# Patient Record
Sex: Female | Born: 1970 | Race: Black or African American | Hispanic: No | Marital: Single | Smoking: Current some day smoker
Health system: Southern US, Community
[De-identification: ages and names within clinical notes are randomized; demographics above are authoritative.]

## PROBLEM LIST (undated history)

## (undated) DIAGNOSIS — K589 Irritable bowel syndrome without diarrhea: Secondary | ICD-10-CM

## (undated) DIAGNOSIS — N83202 Unspecified ovarian cyst, left side: Secondary | ICD-10-CM

## (undated) DIAGNOSIS — K760 Fatty (change of) liver, not elsewhere classified: Secondary | ICD-10-CM

## (undated) DIAGNOSIS — I10 Essential (primary) hypertension: Secondary | ICD-10-CM

## (undated) HISTORY — DX: Unspecified ovarian cyst, left side: N83.202

## (undated) HISTORY — DX: Fatty (change of) liver, not elsewhere classified: K76.0

## (undated) HISTORY — DX: Irritable bowel syndrome, unspecified: K58.9

## (undated) HISTORY — PX: ABDOMINAL HYSTERECTOMY: SHX81

## (undated) HISTORY — DX: Essential (primary) hypertension: I10

---

## 1998-07-11 ENCOUNTER — Other Ambulatory Visit: Admission: RE | Admit: 1998-07-11 | Discharge: 1998-07-11 | Payer: Self-pay | Admitting: Obstetrics and Gynecology

## 1999-12-05 ENCOUNTER — Other Ambulatory Visit: Admission: RE | Admit: 1999-12-05 | Discharge: 1999-12-05 | Payer: Self-pay | Admitting: Obstetrics and Gynecology

## 2001-07-28 ENCOUNTER — Other Ambulatory Visit: Admission: RE | Admit: 2001-07-28 | Discharge: 2001-07-28 | Payer: Self-pay | Admitting: Obstetrics and Gynecology

## 2003-06-25 ENCOUNTER — Emergency Department (HOSPITAL_COMMUNITY): Admission: EM | Admit: 2003-06-25 | Discharge: 2003-06-25 | Payer: Self-pay | Admitting: *Deleted

## 2003-06-26 ENCOUNTER — Other Ambulatory Visit: Admission: RE | Admit: 2003-06-26 | Discharge: 2003-06-26 | Payer: Self-pay | Admitting: Obstetrics and Gynecology

## 2004-08-08 ENCOUNTER — Emergency Department (HOSPITAL_COMMUNITY): Admission: EM | Admit: 2004-08-08 | Discharge: 2004-08-08 | Payer: Self-pay | Admitting: *Deleted

## 2005-09-29 ENCOUNTER — Ambulatory Visit: Payer: Self-pay | Admitting: Internal Medicine

## 2005-12-28 ENCOUNTER — Emergency Department (HOSPITAL_COMMUNITY): Admission: EM | Admit: 2005-12-28 | Discharge: 2005-12-28 | Payer: Self-pay | Admitting: Emergency Medicine

## 2006-05-11 ENCOUNTER — Encounter: Payer: Self-pay | Admitting: Internal Medicine

## 2006-06-30 ENCOUNTER — Ambulatory Visit (HOSPITAL_COMMUNITY): Admission: RE | Admit: 2006-06-30 | Discharge: 2006-06-30 | Payer: Self-pay | Admitting: Obstetrics and Gynecology

## 2006-10-18 ENCOUNTER — Other Ambulatory Visit: Payer: Self-pay | Admitting: Obstetrics & Gynecology

## 2006-10-20 ENCOUNTER — Encounter: Payer: Self-pay | Admitting: Obstetrics & Gynecology

## 2006-10-20 ENCOUNTER — Inpatient Hospital Stay (HOSPITAL_COMMUNITY): Admission: RE | Admit: 2006-10-20 | Discharge: 2006-10-23 | Payer: Self-pay | Admitting: Obstetrics & Gynecology

## 2010-07-22 ENCOUNTER — Other Ambulatory Visit: Payer: Self-pay | Admitting: Obstetrics & Gynecology

## 2010-07-24 ENCOUNTER — Other Ambulatory Visit: Payer: Self-pay | Admitting: Obstetrics & Gynecology

## 2010-07-24 ENCOUNTER — Ambulatory Visit (HOSPITAL_COMMUNITY)
Admission: RE | Admit: 2010-07-24 | Discharge: 2010-07-24 | Disposition: A | Discharge status: Home / Self Care | Payer: BC Managed Care – PPO | Source: Ambulatory Visit | Attending: Obstetrics & Gynecology | Admitting: Obstetrics & Gynecology

## 2010-07-24 DIAGNOSIS — K769 Liver disease, unspecified: Secondary | ICD-10-CM | POA: Insufficient documentation

## 2010-07-24 DIAGNOSIS — R109 Unspecified abdominal pain: Secondary | ICD-10-CM | POA: Insufficient documentation

## 2010-08-19 NOTE — H&P (Signed)
NAMESINTIA, MCKISSIC             ACCOUNT NO.:  [id number]   MEDICAL RECORD NO.:  [id number]          PATIENT TYPE:  INP   LOCATION:  A339                          FACILITY:  [facility]   PHYSICIAN:  Lazaro Arms, M.D.   DATE OF BIRTH:  [birth year]   DATE OF ADMISSION:  10/20/2006  DATE OF DISCHARGE:  LH                              HISTORY & PHYSICAL   Regina Castillo is a 40 year old African American female status post C-section  x3 with hypertension who has an enlarged fibroid uterus, approximately  18-week size, with significant menometrorrhagia and dysmenorrhea that  has been worsening over time.  She was considering at one time having  ablation when she saw another physician, and I told her that I really  did not think this was an option given her significantly enlarged  fibroid uterus and the pain that is associated with it, not necessarily  with her period.  She took awhile to think about it and has decided that  is appropriate therapy.  As a result, we will proceed with TAH and  preserve her ovaries.   PAST MEDICAL HISTORY:  Hypertension.   PAST SURGICAL HISTORY:  C-section x3.   MEDICATIONS:  Norvasc and hydrochlorothiazide.   REVIEW OF SYSTEMS:  Otherwise negative.   ALLERGIES:  NO KNOWN DRUG ALLERGIES.   PHYSICAL EXAMINATION:  HEENT:  Unremarkable.  NECK:  Thyroid is normal.  LUNGS:  Clear.  HEART:  Regular rate and rhythm without murmurs, rubs, or gallops.  BREASTS:  Without masses, discharge, or skin changes.  ABDOMEN:  Soft.  There was a pelvic mass you could feel rising up out of  the pelvis to just below the umbilicus.  PELVIC:  Enlarged fibroid uterus.  She has normal external female  genitalia.  The uterus rises to just below the umbilicus.  The adnexa is  negative.  EXTREMITIES:  Warm with no edema.  NEUROLOGIC:  Grossly intact.   IMPRESSION:  1. Enlarged fibroid uterus, 18-week size.  2. Menometrorrhagia.  3. Dysmenorrhea.   PLAN:  The patient is admitted  for a TAH.  She understands the risks,  benefits, indications, and alternatives and will proceed.      Lazaro Arms, M.D.  Electronically Signed     LHE/MEDQ  D:  10/22/2006  T:  10/22/2006  Job:  [id number]

## 2010-08-19 NOTE — Op Note (Signed)
Regina Castillo, Regina Castillo             ACCOUNT NO.:  [id number]   MEDICAL RECORD NO.:  [id number]          PATIENT TYPE:  INP   LOCATION:  A339                          FACILITY:  [facility]   PHYSICIAN:  Lazaro Arms, M.D.   DATE OF BIRTH:  [birth year]   DATE OF PROCEDURE:  DATE OF DISCHARGE:                               OPERATIVE REPORT   PREOPERATIVE DIAGNOSES:  1. An 18-week size fibroid uterus.  2. Menometrorrhagia.  3. Dysmenorrhea.   POSTOPERATIVE DIAGNOSES:  1. An 18-week size fibroid uterus.  2. Menometrorrhagia.  3. Dysmenorrhea.   PROCEDURE:  Abdominal hysterectomy.   SURGEON:  Lazaro Arms, M.D.   ANESTHESIA:  General endotracheal.   FINDINGS:  Patient had multiple fibroid uterus, was up to the level of  her umbilicus.  Ovaries and tubes were normal.  The rest of the  peritoneal cavity was normal.   DESCRIPTION OF OPERATION:  Patient was taken to the operating room and  placed in the supine position, where she underwent general endotracheal  anesthesia.  The vagina was prepped.  A Foley catheter was placed.  Abdomen was prepped and draped in the usual sterile fashion.  A  Pfannenstiel skin incision was made and carried down sharply to the  rectus fascia, which was scored in the midline and extended laterally.  The fascia was taken off the muscles superiorly and inferiorly without  difficulty.  The peritoneal cavity was entered.  A small Deaver was used  for sidewall retraction, but the uterus was delivered through the  incision.  The upper abdomen was packed away.  The left round ligament  was suture ligated and cut.  The utero-ovarian ligament was isolated.  It was clamped, cut, and double suture-ligated.  The vesicouterine  serosal flap on the left was created.  The right round ligament was  isolated.  It was suture ligated and cut.  The utero-ovarian ligament on  the right was clamped, cut, and double suture ligated.  The  vesicouterine serosal flap on the right  was created.  She had dense  adhesions of the bladder to the uterus and lower uterine segment.  Great  care was taken to sharply take down the bladder and not to injure it.  I  actually had to do interfascial procedure in order to get it down off  the cervix safely.  The uterine vessels were then clamped, cut, and  suture ligated.  A pedicle was taken down the cervix to the cardinal  ligament.  The pedicle was clamped, cut, and suture ligated bilaterally.  The uterus was then amputated.  Serial pedicles were then taken down the  cervix through the cardinal ligaments, each pedicle being clamped, cut,  and suture-ligated.  They incised the vagina in the midline.  Again,  insuring being well away from the bladder.  The bladder was dissected  down.  The cervix was then removed sharply.  Vaginal angle sutures were  placed bilaterally, and the vagina was closed with interrupted figure-of-  eight sutures.  The pelvis was irrigated vigorously.  All pedicles were  found to be hemostatic.  The  Intercede was placed over the vaginal cuff  to try to prevent postoperative adhesions.  All pedicles again were  inspected and found to be hemostatic.  The peritoneum was reapproximated  loosely.  The fascia was closed using 0 Vicryl running.  Subcutaneous  tissue was made hemostatic and irrigated.  The skin actually was  redundant once I removed the uterus, so I took about 1.5 cm of skin  superiorly and removed it and subcutaneous fat as well.  I made it  hemostatic, irrigated it, and then reapproximated with skin staples.  The patient tolerated the procedure well.  She experienced 250 cc of  blood loss and was taken to the recovery room in good, stable condition.  Counts were correct x3.  She received Ancef prophylactically.      Lazaro Arms, M.D.  Electronically Signed     LHE/MEDQ  D:  10/20/2006  T:  10/20/2006  Job:  [id number]

## 2010-08-19 NOTE — Discharge Summary (Signed)
NAMEJAYLYN, Castillo             ACCOUNT NO.:  [id number]   MEDICAL RECORD NO.:  [id number]          PATIENT TYPE:  INP   LOCATION:  A339                          FACILITY:  [facility]   PHYSICIAN:  Lazaro Arms, M.D.   DATE OF BIRTH:  December 09, 1970   DATE OF ADMISSION:  10/20/2006  DATE OF DISCHARGE:  07/19/2008LH                               DISCHARGE SUMMARY   DISCHARGE DIAGNOSES:  1. Status post abdominal hysterectomy.  2. Unremarkable postoperative course.   PROCEDURE:  Abdominal hysterectomy.   Please refer to the History and Physical and operative report for  details of admission to the hospital.   HOSPITAL COURSE:  The patient was admitted after surgery.  Intraoperative course unremarkable.  She tolerated clear liquids, had  some problems with nausea and vomiting postoperatively that resolved in  about 48 hours.  As a result, we kept her an extra day.  She progressed  to full liquids and a regular diet and did well.  She has tolerated oral  pain medicines.  She has been essentially ambulatory, she has been  afebrile.  Hemoglobin and hematocrit postoperatively have been stable.  Again, her incision is clean, dry and intact.  She is discharged to home  on the morning of postop day #3 in good stable condition to follow up in  the office next Wednesday to have her staples removed.  She was given  instructions, precautions for return prior to that time.  She is  discharged home on Percocet, Motrin and Phenergan.      Lazaro Arms, M.D.  Electronically Signed     LHE/MEDQ  D:  10/23/2006  T:  10/23/2006  Job:  [id number]

## 2010-09-09 ENCOUNTER — Ambulatory Visit (INDEPENDENT_AMBULATORY_CARE_PROVIDER_SITE_OTHER): Payer: BC Managed Care – PPO | Admitting: Internal Medicine

## 2010-10-23 ENCOUNTER — Ambulatory Visit (INDEPENDENT_AMBULATORY_CARE_PROVIDER_SITE_OTHER): Payer: BC Managed Care – PPO | Admitting: Internal Medicine

## 2010-11-06 ENCOUNTER — Encounter (INDEPENDENT_AMBULATORY_CARE_PROVIDER_SITE_OTHER): Payer: Self-pay

## 2010-12-09 ENCOUNTER — Ambulatory Visit (INDEPENDENT_AMBULATORY_CARE_PROVIDER_SITE_OTHER): Payer: BC Managed Care – PPO | Admitting: Internal Medicine

## 2011-01-19 LAB — CBC
HCT: 31.5 — ABNORMAL LOW
Hemoglobin: 10.6 — ABNORMAL LOW
Hemoglobin: 10.7 — ABNORMAL LOW
Platelets: 321
RBC: 3.41 — ABNORMAL LOW
RBC: 3.41 — ABNORMAL LOW
RDW: 13.6
WBC: 10.8 — ABNORMAL HIGH
WBC: 18.9 — ABNORMAL HIGH

## 2011-01-19 LAB — DIFFERENTIAL
Basophils Absolute: 0
Eosinophils Absolute: 0
Eosinophils Absolute: 0.1
Eosinophils Relative: 0
Lymphs Abs: 2.2
Monocytes Absolute: 1.2 — ABNORMAL HIGH
Monocytes Relative: 9
Neutro Abs: 15.9 — ABNORMAL HIGH
Neutro Abs: 7.3
Neutrophils Relative %: 68

## 2011-01-19 LAB — URINALYSIS, ROUTINE W REFLEX MICROSCOPIC
Bilirubin Urine: NEGATIVE
Glucose, UA: NEGATIVE
Ketones, ur: NEGATIVE
Specific Gravity, Urine: 1.02
Urobilinogen, UA: 0.2
pH: 6

## 2011-01-19 LAB — URINE MICROSCOPIC-ADD ON

## 2011-01-20 LAB — COMPREHENSIVE METABOLIC PANEL
AST: 18
Alkaline Phosphatase: 59
BUN: 12
CO2: 29
GFR calc non Af Amer: >60
Potassium: 4
Total Protein: 6.7

## 2011-01-20 LAB — TYPE AND SCREEN
ABO/RH(D): B NEG
ABO/RH(D): B NEG
Antibody Screen: NEGATIVE
Antibody Screen: NEGATIVE

## 2011-01-20 LAB — CBC
HCT: 37.3
Hemoglobin: 12.6
RDW: 13.5
WBC: 6.9

## 2011-01-20 LAB — DIFFERENTIAL
Basophils Relative: 1
Lymphs Abs: 2.2
Monocytes Relative: 10
Neutro Abs: 3.9

## 2011-01-20 LAB — HCG, SERUM, QUALITATIVE: Preg, Serum: NEGATIVE

## 2012-07-28 ENCOUNTER — Other Ambulatory Visit (HOSPITAL_COMMUNITY): Payer: Self-pay | Admitting: Internal Medicine

## 2012-07-28 DIAGNOSIS — Z139 Encounter for screening, unspecified: Secondary | ICD-10-CM

## 2012-08-02 ENCOUNTER — Ambulatory Visit (HOSPITAL_COMMUNITY): Payer: BC Managed Care – PPO

## 2012-08-08 ENCOUNTER — Ambulatory Visit (HOSPITAL_COMMUNITY): Payer: BC Managed Care – PPO

## 2012-12-08 ENCOUNTER — Other Ambulatory Visit: Payer: Self-pay | Admitting: Obstetrics & Gynecology

## 2015-01-15 ENCOUNTER — Other Ambulatory Visit: Payer: Self-pay | Admitting: Obstetrics & Gynecology

## 2015-02-12 ENCOUNTER — Encounter: Payer: Self-pay | Admitting: Obstetrics & Gynecology

## 2015-02-12 ENCOUNTER — Ambulatory Visit (INDEPENDENT_AMBULATORY_CARE_PROVIDER_SITE_OTHER): Payer: BC Managed Care – PPO | Admitting: Obstetrics & Gynecology

## 2015-02-12 VITALS — BP 120/80 | HR 72 | Ht 60.0 in | Wt 145.0 lb

## 2015-02-12 DIAGNOSIS — Z01419 Encounter for gynecological examination (general) (routine) without abnormal findings: Secondary | ICD-10-CM | POA: Diagnosis not present

## 2015-02-12 DIAGNOSIS — E038 Other specified hypothyroidism: Secondary | ICD-10-CM

## 2015-02-12 DIAGNOSIS — R7989 Other specified abnormal findings of blood chemistry: Secondary | ICD-10-CM

## 2015-02-12 DIAGNOSIS — E7800 Pure hypercholesterolemia, unspecified: Secondary | ICD-10-CM

## 2015-02-13 LAB — VITAMIN D 25 HYDROXY (VIT D DEFICIENCY, FRACTURES): Vit D, 25-Hydroxy: 24.7 ng/mL — ABNORMAL LOW (ref 30.0–100.0)

## 2015-02-13 LAB — LIPID PANEL
CHOL/HDL RATIO: 2.2 ratio (ref 0.0–4.4)
Cholesterol, Total: 185 mg/dL (ref 100–199)
HDL: 86 mg/dL (ref >39)
LDL Calculated: 83 mg/dL (ref 0–99)
Triglycerides: 78 mg/dL (ref 0–149)
VLDL Cholesterol Cal: 16 mg/dL (ref 5–40)

## 2015-02-13 LAB — TSH: TSH: 1.49 u[IU]/mL (ref 0.450–4.500)

## 2015-02-13 NOTE — Progress Notes (Signed)
Patient ID: Regina Castillo, female   DOB: 1971-02-10, 44 y.o.   MRN: [id number] Subjective:     Regina NetStephanie R Castillo is a 44 y.o. female here for a routine exam.  No LMP recorded. Patient has had a hysterectomy. No obstetric history on file. Birth Control Method:  hysterectomy Menstrual Calendar(currently): hysterectomy  Current complaints: none.   Current acute medical issues:  none   Recent Gynecologic History No LMP recorded. Patient has had a hysterectomy. Last Pap: ,   Last mammogram: 2012,  normal  Past Medical History  Diagnosis Date  . Fatty liver   . Irritable bowel syndrome   . Cyst of ovary, left     Past Surgical History  Procedure Laterality Date  . Abdominal hysterectomy      OB History    No data available      Social History   Social History  . Marital Status: Single    Spouse Name: N/A  . Number of Children: N/A  . Years of Education: N/A   Social History Main Topics  . Smoking status: Current Every Day Smoker  . Smokeless tobacco: None  . Alcohol Use: None  . Drug Use: None  . Sexual Activity: Not Asked   Other Topics Concern  . None   Social History Narrative    History reviewed. No pertinent family history.   Current outpatient prescriptions:  .  amLODipine (NORVASC) 10 MG tablet, Take by mouth daily.  , Disp: , Rfl:  .  benazepril-hydrochlorthiazide (LOTENSIN HCT) 10-12.5 MG per tablet, Take by mouth daily.  , Disp: , Rfl:  .  calcium-vitamin D (OSCAL) 250-125 MG-UNIT per tablet, Take by mouth daily.  , Disp: , Rfl:   Review of Systems  Review of Systems  Constitutional: Negative for fever, chills, weight loss, malaise/fatigue and diaphoresis.  HENT: Negative for hearing loss, ear pain, nosebleeds, congestion, sore throat, neck pain, tinnitus and ear discharge.   Eyes: Negative for blurred vision, double vision, photophobia, pain, discharge and redness.  Respiratory: Negative for cough, hemoptysis, sputum production, shortness of  breath, wheezing and stridor.   Cardiovascular: Negative for chest pain, palpitations, orthopnea, claudication, leg swelling and PND.  Gastrointestinal: negative for abdominal pain. Negative for heartburn, nausea, vomiting, diarrhea, constipation, blood in stool and melena.  Genitourinary: Negative for dysuria, urgency, frequency, hematuria and flank pain.  Musculoskeletal: Negative for myalgias, back pain, joint pain and falls.  Skin: Negative for itching and rash.  Neurological: Negative for dizziness, tingling, tremors, sensory change, speech change, focal weakness, seizures, loss of consciousness, weakness and headaches.  Endo/Heme/Allergies: Negative for environmental allergies and polydipsia. Does not bruise/bleed easily.  Psychiatric/Behavioral: Negative for depression, suicidal ideas, hallucinations, memory loss and substance abuse. The patient is not nervous/anxious and does not have insomnia.        Objective:  Blood pressure 120/80, pulse 72, height 5' (1.524 m), weight 145 lb (65.772 kg).   Physical Exam  Vitals reviewed. Constitutional: She is oriented to person, place, and time. She appears well-developed and well-nourished.  HENT:  Head: Normocephalic and atraumatic.        Right Ear: External ear normal.  Left Ear: External ear normal.  Nose: Nose normal.  Mouth/Throat: Oropharynx is clear and moist.  Eyes: Conjunctivae and EOM are normal. Pupils are equal, round, and reactive to light. Right eye exhibits no discharge. Left eye exhibits no discharge. No scleral icterus.  Neck: Normal range of motion. Neck supple. No tracheal deviation present. No thyromegaly present.  Cardiovascular: Normal rate, regular rhythm, normal heart sounds and intact distal pulses.  Exam reveals no gallop and no friction rub.   No murmur heard. Respiratory: Effort normal and breath sounds normal. No respiratory distress. She has no wheezes. She has no rales. She exhibits no tenderness.  GI: Soft.  Bowel sounds are normal. She exhibits no distension and no mass. There is no tenderness. There is no rebound and no guarding.  Genitourinary:  Breasts no masses skin changes or nipple changes bilaterally      Vulva is normal without lesions Vagina is pink moist without discharge Cervix absent Uterus is absent Adnexa is negative with normal sized ovaries   Musculoskeletal: Normal range of motion. She exhibits no edema and no tenderness.  Neurological: She is alert and oriented to person, place, and time. She has normal reflexes. She displays normal reflexes. No cranial nerve deficit. She exhibits normal muscle tone. Coordination normal.  Skin: Skin is warm and dry. No rash noted. No erythema. No pallor.  Psychiatric: She has a normal mood and affect. Her behavior is normal. Judgment and thought content normal.       Assessment:    Healthy female exam.    Plan:    Mammogram ordered. Follow up in: 1 year. TSH VIT D Lipid panel

## 2015-02-14 ENCOUNTER — Telehealth: Payer: Self-pay | Admitting: *Deleted

## 2015-02-14 NOTE — Telephone Encounter (Signed)
-----   Message from Luther H Eure, MD sent at 02/14/2015  8:22 AM EST ----- Call pt and tell her vit d is low start taking 2000-5000 units of vitamin D3 daily with a meal  Thanks  Dr Eure ----- Message -----    From: [facility] Lab Results In Interface    Sent: 02/13/2015   7:43 AM      To: Luther H Eure, MD    

## 2015-02-15 NOTE — Telephone Encounter (Signed)
-----   Message from Lazaro ArmsLuther H Eure, MD sent at 02/14/2015  8:22 AM EST ----- Call pt and tell her vit d is low start taking 2000-5000 units of vitamin D3 daily with a meal  Thanks  Dr Despina HiddenEure ----- Message -----    From: [facility] Lab Results In Interface    Sent: 02/13/2015   7:43 AM      To: Lazaro ArmsLuther H Eure, MD

## 2015-02-18 ENCOUNTER — Telehealth: Payer: Self-pay | Admitting: Obstetrics & Gynecology

## 2015-02-18 NOTE — Telephone Encounter (Signed)
Pt informed of low Vit D from 02/12/2015 per Dr. Despina HiddenEure take 2000-5000 unit of Vitamin D3 daily with meals. Pt verbalized understanding.

## 2015-02-18 NOTE — Telephone Encounter (Signed)
Pt informed of low Vit D start taking Vit D3 2000-5000 daily with meals per Dr. Despina HiddenEure.

## 2015-09-26 ENCOUNTER — Encounter: Payer: Self-pay | Admitting: Obstetrics & Gynecology

## 2016-02-14 ENCOUNTER — Other Ambulatory Visit: Payer: BC Managed Care – PPO | Admitting: Obstetrics & Gynecology

## 2016-02-17 ENCOUNTER — Other Ambulatory Visit: Payer: BC Managed Care – PPO | Admitting: Obstetrics & Gynecology

## 2016-03-10 ENCOUNTER — Ambulatory Visit (INDEPENDENT_AMBULATORY_CARE_PROVIDER_SITE_OTHER): Payer: BLUE CROSS/BLUE SHIELD | Admitting: Advanced Practice Midwife

## 2016-03-10 ENCOUNTER — Encounter (INDEPENDENT_AMBULATORY_CARE_PROVIDER_SITE_OTHER): Payer: Self-pay

## 2016-03-10 ENCOUNTER — Encounter: Payer: Self-pay | Admitting: Advanced Practice Midwife

## 2016-03-10 VITALS — BP 134/82 | HR 72 | Ht 59.75 in | Wt 147.0 lb

## 2016-03-10 DIAGNOSIS — R7989 Other specified abnormal findings of blood chemistry: Secondary | ICD-10-CM

## 2016-03-10 DIAGNOSIS — Z01411 Encounter for gynecological examination (general) (routine) with abnormal findings: Secondary | ICD-10-CM | POA: Diagnosis not present

## 2016-03-10 DIAGNOSIS — E559 Vitamin D deficiency, unspecified: Secondary | ICD-10-CM | POA: Diagnosis not present

## 2016-03-10 DIAGNOSIS — Z1329 Encounter for screening for other suspected endocrine disorder: Secondary | ICD-10-CM

## 2016-03-10 DIAGNOSIS — Z01419 Encounter for gynecological examination (general) (routine) without abnormal findings: Secondary | ICD-10-CM

## 2016-03-10 MED ORDER — VITAMIN D 50 MCG (2000 UT) PO TABS: 2000.0000 [IU] | ORAL_TABLET | Freq: Every day | ORAL | 11 refills | Status: DC

## 2016-03-10 NOTE — Progress Notes (Signed)
Regina Castillo 45 y.o.  Vitals:   03/10/16 0856  BP: 134/82  Pulse: 72     Filed Weights   03/10/16 0856  Weight: 147 lb (66.7 kg)    Past Medical History: Past Medical History:  Diagnosis Date  . Cyst of ovary, left   . Fatty liver   . Irritable bowel syndrome     Past Surgical History: Past Surgical History:  Procedure Laterality Date  . ABDOMINAL HYSTERECTOMY      Family History: Family History  Problem Relation Age of Onset  . Diabetes Father   . Diabetes Mother     Social History: Social History  Substance Use Topics  . Smoking status: Current Every Day Smoker    Packs/day: 0.25  . Smokeless tobacco: Never Used  . Alcohol use Yes     Comment: occ    Allergies: No Known Allergies    Current Outpatient Prescriptions:  .  cetirizine (ZYRTEC) 10 MG tablet, Take 10 mg by mouth daily., Disp: , Rfl:  .  amLODipine (NORVASC) 10 MG tablet, Take by mouth daily.  , Disp: , Rfl:  .  benazepril-hydrochlorthiazide (LOTENSIN HCT) 10-12.5 MG per tablet, Take by mouth daily.  , Disp: , Rfl:  .  calcium-vitamin D (OSCAL) 250-125 MG-UNIT per tablet, Take by mouth daily.  , Disp: , Rfl:  .  cholecalciferol 2000 units tablet, Take 1 tablet (2,000 Units total) by mouth daily., Disp: 30 tablet, Rfl: 11  History of Present Illness: Here for pap and physical.  Thought that she "might" still have a cervix", but op report reveals that her cervix was not left during hyst (for fibroids).  Does have ovaries.  Goes to [facility], got cholesterol and glucose checked at work (all great!).  On vitamin D since last year.  Wants a rx for it instead of OTC.  Mammograms have been normal, gets them yearly.  Trying to lose weight:  Exercising, but upon reviewing diet, still eats a lot of carbs (unknowingly).  Unhappy the way she has developed some firmness (scar tissue??) under scar.     Review of Systems   Patient denies any headaches, blurred vision, shortness of breath, chest pain,  abdominal pain, problems with bowel movements, urination, or intercourse.   Physical Exam: General:  Well developed, well nourished, no acute distress Skin:  Warm and dry Neck:  Midline trachea, normal thyroid Lungs; Clear to auscultation bilaterally Breast:  No dominant palpable mass, retraction, or nipple discharge Cardiovascular: Regular rate and rhythm Abdomen:  Soft, non tender, no hepatosplenomegaly Pelvic:  External genitalia is normal in appearance.  The vagina is normal in appearance.  The uterus and cervix are absent  No adnexal masses or tenderness noted.  Extremities:  No swelling or varicosities noted Psych:  No mood changes.     Impression: normal exam     Plan: does not need pap smears.  Can still come to us for yearly physicals (or to [facility]).  Discussed 3D mammograms (has "cat b density" of breasts"), so may wait until 50 if she wants.  Will check TSH and Vit D level today.

## 2016-03-11 LAB — TSH: TSH: 1.61 u[IU]/mL (ref 0.450–4.500)

## 2016-03-11 LAB — VITAMIN D 25 HYDROXY (VIT D DEFICIENCY, FRACTURES): VIT D 25 HYDROXY: 24.5 ng/mL — AB (ref 30.0–100.0)

## 2016-04-09 ENCOUNTER — Telehealth: Payer: Self-pay | Admitting: Advanced Practice Midwife

## 2016-04-09 ENCOUNTER — Encounter: Payer: Self-pay | Admitting: Advanced Practice Midwife

## 2016-04-09 DIAGNOSIS — K760 Fatty (change of) liver, not elsewhere classified: Secondary | ICD-10-CM | POA: Insufficient documentation

## 2016-04-09 DIAGNOSIS — K589 Irritable bowel syndrome without diarrhea: Secondary | ICD-10-CM | POA: Insufficient documentation

## 2016-04-09 NOTE — Telephone Encounter (Signed)
Pt called stating that she would like to know the results of her thyroid blood work. Please contact pt

## 2016-04-09 NOTE — Telephone Encounter (Signed)
Pt informed of normal TSH and low vit D, pt states she has a RX for vitamin D that she needs to pick up from the pharamcy.  Pt states several years ago she was told she had white spots on her liver, she can not remember who told her but believes it was [name] at it was at an exam around the time she had surgery.  She is concerned that this is something she should be having looked into.  Please advise

## 2016-04-09 NOTE — Telephone Encounter (Signed)
Pt informed of Fran's note, she states she does go out to [place] and is trying to take better care of her health and she will make sure they keep an eye on her blood sugar and cholesterol.

## 2016-04-09 NOTE — Telephone Encounter (Signed)
US from 4/12 shows "minimal fatty liver" which can be caused by eating too many carbs, obestiy, diabetes, alcohol, high cholesterol.  If I remember correctly, her PCP keeps an eye on her labs.  If not, I can order screening for diabetes/cholesterol, etc.  PCP should guide treatment/surveillence (def not in scope of GYN), but better diet is the only good treatment.

## 2017-05-21 ENCOUNTER — Ambulatory Visit: Payer: Self-pay | Admitting: Family Medicine

## 2017-06-30 ENCOUNTER — Other Ambulatory Visit: Payer: Self-pay

## 2017-06-30 ENCOUNTER — Encounter: Payer: Self-pay | Admitting: Family Medicine

## 2017-06-30 ENCOUNTER — Ambulatory Visit (INDEPENDENT_AMBULATORY_CARE_PROVIDER_SITE_OTHER): Payer: Commercial Managed Care - PPO | Admitting: Family Medicine

## 2017-06-30 ENCOUNTER — Other Ambulatory Visit (HOSPITAL_COMMUNITY)
Admission: RE | Admit: 2017-06-30 | Discharge: 2017-06-30 | Disposition: A | Discharge status: Home / Self Care | Payer: Commercial Managed Care - PPO | Source: Ambulatory Visit | Attending: Family Medicine | Admitting: Family Medicine

## 2017-06-30 VITALS — BP 152/96 | HR 92 | Temp 98.1°F | Resp 16 | Ht 60.0 in | Wt 149.8 lb

## 2017-06-30 DIAGNOSIS — I10 Essential (primary) hypertension: Secondary | ICD-10-CM

## 2017-06-30 DIAGNOSIS — R140 Abdominal distension (gaseous): Secondary | ICD-10-CM

## 2017-06-30 DIAGNOSIS — Z113 Encounter for screening for infections with a predominantly sexual mode of transmission: Secondary | ICD-10-CM | POA: Diagnosis not present

## 2017-06-30 DIAGNOSIS — E559 Vitamin D deficiency, unspecified: Secondary | ICD-10-CM

## 2017-06-30 DIAGNOSIS — E663 Overweight: Secondary | ICD-10-CM

## 2017-06-30 DIAGNOSIS — Z0000 Encounter for general adult medical examination without abnormal findings: Secondary | ICD-10-CM

## 2017-06-30 DIAGNOSIS — Z720 Tobacco use: Secondary | ICD-10-CM

## 2017-06-30 NOTE — Progress Notes (Signed)
Patient ID: Regina Castillo, female    DOB: [birth year], 47 y.o.   MRN: [id number]  Chief Complaint  Patient presents with  . Bloated    Allergies Patient has no known allergies.  Subjective:   Regina Castillo is a 47 y.o. female who presents to [facility] today.  HPI Regina Castillo presents as a new patient visit to establish care.  She reports that she knows she has some blood pressure issues and would like to get those under control.  Works at [employer] and has been seen recently by Publishing rights manager at the on-site clinic.  Has a history of high blood pressure but had been off medication for several years.  Was placed on Norvasc 5 mg, 1 p.o. daily a month or so ago.  She has not taken the medication today or for the past several days per her report.  She reports that she was diagnosed with hypertension several years ago and was initially treated with an ACE/diuretic combination.  She reports that she had to take the medication at night because it made her dizzy.  She denies any chest pain, shortness of breath, or swelling in her extremities.  She denies any palpitations.  She reports she does get tired at times but usually her energy level is pretty good.  She does smoke occasional cigarettes in the evenings.  She drinks alcohol occasionally.  Denies any illicit drug use.  Reports that she has had lots of abdominal bloating and gas over the past year.  She reports that after eating her abdomen is very distended and looks as if she is pregnant.  She reports some occasional abdominal cramping after eating.  She reports that often after eating certain foods and especially [place] food she has abdominal cramping and diarrhea within 15 minutes of completing her meal.  She denies any bright red blood per rectum.  She denies any personal or family history of colon cancer.  She has never had a colonoscopy performed.  Started probiotics several months ago to help with  her symptoms.  She has not seen any improvement since starting the probiotics.  She reports that her stool is often different colors and can be tan or darker in coloration.  She does not believe that she is seeing any blood in her stool.  She reports that the symptoms did not occur until after her hysterectomy several years ago.  However over the past several months they seem to have worsened.  She does report since starting her job at [employer] that she has gained 5-10 pounds.  She does eat out at fast food restaurants multiple times a week.  She denies any nausea, vomiting, weight loss.   Past Medical History:  Diagnosis Date  . Cyst of ovary, left   . Fatty liver   . Hypertension   . Irritable bowel syndrome     Past Surgical History:  Procedure Laterality Date  . ABDOMINAL HYSTERECTOMY    . CESAREAN SECTION      Family History  Problem Relation Age of Onset  . Diabetes Father   . Diabetes Mother   . Hypertension Mother      Social History   Socioeconomic History  . Marital status: Single    Spouse name: Not on file  . Number of children: Not on file  . Years of education: Not on file  . Highest education level: Not on file  Occupational History  . Not on file  Social Needs  . Financial resource strain: Not on file  . Food insecurity:    Worry: Not on file    Inability: Not on file  . Transportation needs:    Medical: Not on file    Non-medical: Not on file  Tobacco Use  . Smoking status: Current Some Day Smoker    Packs/day: 0.25    Types: Cigarettes  . Smokeless tobacco: Never Used  Substance and Sexual Activity  . Alcohol use: Yes    Comment: occasionally  . Drug use: No  . Sexual activity: Not Currently  Lifestyle  . Physical activity:    Days per week: Not on file    Minutes per session: Not on file  . Stress: Not on file  Relationships  . Social connections:    Talks on phone: Not on file    Gets together: Not on file    Attends religious service: Not  on file    Active member of club or organization: Not on file    Attends meetings of clubs or organizations: Not on file    Relationship status: Not on file  Other Topics Concern  . Not on file  Social History Narrative   Works for [employer]. Single, not dating. Live with mother. Has grown children, 3 boys.  Eats meat fruits and vegetables.  Wears seatbelt.  Currently lives with mother.   Current Outpatient Medications on File Prior to Visit  Medication Sig Dispense Refill  . amLODipine (NORVASC) 5 MG tablet   3  . cetirizine (ZYRTEC) 10 MG tablet Take 10 mg by mouth daily.    . Lactobacillus (ACIDOPHILUS) TABS Take by mouth.    . TURMERIC PO Take by mouth.     No current facility-administered medications on file prior to visit.     Review of Systems  Constitutional: Positive for fatigue. Negative for activity change, appetite change, chills, diaphoresis, fever and unexpected weight change.  HENT: Negative for trouble swallowing and voice change.   Eyes: Negative for visual disturbance.  Respiratory: Negative for cough, chest tightness and shortness of breath.   Cardiovascular: Negative for chest pain, palpitations and leg swelling.  Gastrointestinal: Positive for abdominal distention and diarrhea. Negative for abdominal pain, anal bleeding, blood in stool, constipation, nausea, rectal pain and vomiting.  Endocrine: Negative for polyphagia and polyuria.  Genitourinary: Negative for dysuria, frequency, urgency and vaginal discharge.  Skin: Negative for rash.  Neurological: Negative for dizziness, syncope and light-headedness.  Hematological: Negative for adenopathy. Does not bruise/bleed easily.  Psychiatric/Behavioral: Negative for agitation, behavioral problems, dysphoric mood and suicidal ideas. The patient is not nervous/anxious.      Objective:   BP (!) 152/96 (BP Location: Right Arm, Patient Position: Sitting, Cuff Size: Normal)   Pulse 92   Temp 98.1 F (36.7 C) (Temporal)    Resp 16   Ht 5' (1.524 m)   Wt 149 lb 12 oz (67.9 kg)   SpO2 97%   BMI 29.25 kg/m   Physical Exam  Constitutional: She is oriented to person, place, and time. She appears well-developed and well-nourished. No distress.  HENT:  Head: Normocephalic and atraumatic.  Eyes: Pupils are equal, round, and reactive to light. Conjunctivae are normal. No scleral icterus.  Neck: Normal range of motion. Neck supple. No JVD present. No tracheal deviation present. No thyromegaly present.  Cardiovascular: Normal rate, regular rhythm and normal heart sounds.  No murmur heard. Pulmonary/Chest: Effort normal and breath sounds normal. No respiratory distress.  Abdominal: Soft.  Bowel sounds are normal. She exhibits no distension and no mass. There is no tenderness. There is no guarding.  Lymphadenopathy:    She has no cervical adenopathy.  Neurological: She is alert and oriented to person, place, and time. No cranial nerve deficit.  Skin: Skin is warm and dry.  Psychiatric: She has a normal mood and affect. Her behavior is normal. Judgment and thought content normal.  Nursing note and vitals reviewed.    Assessment and Plan  1. HTN, goal below 140/90 Compliance with medication recommended.  We did discuss options for blood pressure medications.  She has previously been on an ACE and a diuretic and had symptoms of dizziness on those medications.  We did discuss the risks versus benefits of the different classes of medication.  She wishes to continue the Norvasc and titrate up if needed to maintain her blood pressure goal of less than 140/90.  Patient agrees to start the Norvasc 5 mg 1 p.o. daily.  She will follow-up in 2 weeks to recheck her blood pressure.  Smoking cessation was recommended.  DASH diet recommended and discussed today.  We discussed association between elevated blood pressure and risk for stroke. - COMPLETE METABOLIC PANEL WITH GFR - CBC with Differential/Platelet - Hemoglobin A1c - Urine  Microscopic  2. Screen for STD (sexually transmitted disease) Screening requested. - HIV antibody - RPR - Hepatitis panel, acute - Urine cytology ancillary only  3. Vitamin D deficiency Chart review reveals evidence of vitamin D deficiency in the past.  Check levels at this time. - VITAMIN D 25 Hydroxy (Vit-D Deficiency, Fractures)  4. Well adult exam She is to return to clinic for complete physical examination.  5. Abdominal bloating Chronic abdominal bloating with associated cramping and diarrhea.  Reportedly told has a diagnosis of IBS but no workup or evaluation.  Patient requests referral to GI for evaluation. - Ambulatory referral to Gastroenterology  6. Tobacco use Tobacco cessation recommended and discussed with patient.  Risks of tobacco use discussed including increased risk of malignancy, COPD, and cardiovascular risk.  Patient is going to try to cut down on her smoking use.  She is not interested in any medication at this time to assist with smoking cessation.  We will discuss again at follow-up.  7. Overweight (BMI 25.0-29.9) The patient is asked to make an attempt to improve diet and exercise patterns to aid in medical management of this problem.  Did discuss with patient that at this time her BMI is consistent with being overweight.  However, she is very close to the diagnosis of obesity.  We did discuss weight loss and recommended ways to lose weight including dietary modifications and exercise. OV was 30 minutes. Greater than 50% spent counseling patient on the above.  Return in about 1 month (around 07/28/2017) for CPE. Aliene Beams, MD 06/30/2017

## 2017-06-30 NOTE — Patient Instructions (Addendum)
Request immunization record.   DASH Eating Plan DASH stands for "Dietary Approaches to Stop Hypertension." The DASH eating plan is a healthy eating plan that has been shown to reduce high blood pressure (hypertension). It may also reduce your risk for type 2 diabetes, heart disease, and stroke. The DASH eating plan may also help with weight loss. What are tips for following this plan? General guidelines  Avoid eating more than 2,300 mg (milligrams) of salt (sodium) a day. If you have hypertension, you may need to reduce your sodium intake to 1,500 mg a day.  Limit alcohol intake to no more than 1 drink a day for nonpregnant women and 2 drinks a day for men. One drink equals 12 oz of beer, 5 oz of wine, or 1 oz of hard liquor.  Work with your health care provider to maintain a healthy body weight or to lose weight. Ask what an ideal weight is for you.  Get at least 30 minutes of exercise that causes your heart to beat faster (aerobic exercise) most days of the week. Activities may include walking, swimming, or biking.  Work with your health care provider or diet and nutrition specialist (dietitian) to adjust your eating plan to your individual calorie needs. Reading food labels  Check food labels for the amount of sodium per serving. Choose foods with less than 5 percent of the Daily Value of sodium. Generally, foods with less than 300 mg of sodium per serving fit into this eating plan.  To find whole grains, look for the word "whole" as the first word in the ingredient list. Shopping  Buy products labeled as "low-sodium" or "no salt added."  Buy fresh foods. Avoid canned foods and premade or frozen meals. Cooking  Avoid adding salt when cooking. Use salt-free seasonings or herbs instead of table salt or sea salt. Check with your health care provider or pharmacist before using salt substitutes.  Do not fry foods. Cook foods using healthy methods such as baking, boiling, grilling, and  broiling instead.  Cook with heart-healthy oils, such as olive, canola, soybean, or sunflower oil. Meal planning   Eat a balanced diet that includes: ? 5 or more servings of fruits and vegetables each day. At each meal, try to fill half of your plate with fruits and vegetables. ? Up to 6-8 servings of whole grains each day. ? Less than 6 oz of lean meat, poultry, or fish each day. A 3-oz serving of meat is about the same size as a deck of cards. One egg equals 1 oz. ? 2 servings of low-fat dairy each day. ? A serving of nuts, seeds, or beans 5 times each week. ? Heart-healthy fats. Healthy fats called Omega-3 fatty acids are found in foods such as flaxseeds and coldwater fish, like sardines, salmon, and mackerel.  Limit how much you eat of the following: ? Canned or prepackaged foods. ? Food that is high in trans fat, such as fried foods. ? Food that is high in saturated fat, such as fatty meat. ? Sweets, desserts, sugary drinks, and other foods with added sugar. ? Full-fat dairy products.  Do not salt foods before eating.  Try to eat at least 2 vegetarian meals each week.  Eat more home-cooked food and less restaurant, buffet, and fast food.  When eating at a restaurant, ask that your food be prepared with less salt or no salt, if possible. What foods are recommended? The items listed may not be a complete list. Talk  with your dietitian about what dietary choices are best for you. Grains Whole-grain or whole-wheat bread. Whole-grain or whole-wheat pasta. Brown rice. [place]. Bulgur. Whole-grain and low-sodium cereals. Pita bread. Low-fat, low-sodium crackers. Whole-wheat flour tortillas. Vegetables Fresh or frozen vegetables (raw, steamed, roasted, or grilled). Low-sodium or reduced-sodium tomato and vegetable juice. Low-sodium or reduced-sodium tomato sauce and tomato paste. Low-sodium or reduced-sodium canned vegetables. Fruits All fresh, dried, or frozen fruit. Canned  fruit in natural juice (without added sugar). Meat and other protein foods Skinless chicken or [place]. Ground chicken or [place]. Pork with fat trimmed off. Fish and seafood. Egg whites. Dried beans, peas, or lentils. Unsalted nuts, nut butters, and seeds. Unsalted canned beans. Lean cuts of beef with fat trimmed off. Low-sodium, lean deli meat. Dairy Low-fat (1%) or fat-free (skim) milk. Fat-free, low-fat, or reduced-fat cheeses. Nonfat, low-sodium ricotta or cottage cheese. Low-fat or nonfat yogurt. Low-fat, low-sodium cheese. Fats and oils Soft margarine without trans fats. Vegetable oil. Low-fat, reduced-fat, or light mayonnaise and salad dressings (reduced-sodium). Canola, safflower, olive, soybean, and sunflower oils. Avocado. Seasoning and other foods Herbs. Spices. Seasoning mixes without salt. Unsalted popcorn and pretzels. Fat-free sweets. What foods are not recommended? The items listed may not be a complete list. Talk with your dietitian about what dietary choices are best for you. Grains Baked goods made with fat, such as croissants, muffins, or some breads. Dry pasta or rice meal packs. Vegetables Creamed or fried vegetables. Vegetables in a cheese sauce. Regular canned vegetables (not low-sodium or reduced-sodium). Regular canned tomato sauce and paste (not low-sodium or reduced-sodium). Regular tomato and vegetable juice (not low-sodium or reduced-sodium). Angie Fava. Olives. Fruits Canned fruit in a light or heavy syrup. Fried fruit. Fruit in cream or butter sauce. Meat and other protein foods Fatty cuts of meat. Ribs. Fried meat. Berniece Salines. Sausage. Bologna and other processed lunch meats. Salami. Fatback. Hotdogs. Bratwurst. Salted nuts and seeds. Canned beans with added salt. Canned or smoked fish. Whole eggs or egg yolks. Chicken or [place] with skin. Dairy Whole or 2% milk, cream, and half-and-half. Whole or full-fat cream cheese. Whole-fat or sweetened yogurt. Full-fat cheese.  Nondairy creamers. Whipped toppings. Processed cheese and cheese spreads. Fats and oils Butter. Stick margarine. Lard. Shortening. Ghee. Bacon fat. Tropical oils, such as coconut, palm kernel, or palm oil. Seasoning and other foods Salted popcorn and pretzels. Onion salt, garlic salt, seasoned salt, table salt, and sea salt. Worcestershire sauce. Tartar sauce. Barbecue sauce. Teriyaki sauce. Soy sauce, including reduced-sodium. Steak sauce. Canned and packaged gravies. Fish sauce. Oyster sauce. Cocktail sauce. Horseradish that you find on the shelf. Ketchup. Mustard. Meat flavorings and tenderizers. Bouillon cubes. Hot sauce and Tabasco sauce. Premade or packaged marinades. Premade or packaged taco seasonings. Relishes. Regular salad dressings. Where to find more information:  National Heart, Lung, and [place]: https://wilson-eaton.com/  American Heart Association: www.heart.org Summary  The DASH eating plan is a healthy eating plan that has been shown to reduce high blood pressure (hypertension). It may also reduce your risk for type 2 diabetes, heart disease, and stroke.  With the DASH eating plan, you should limit salt (sodium) intake to 2,300 mg a day. If you have hypertension, you may need to reduce your sodium intake to 1,500 mg a day.  When on the DASH eating plan, aim to eat more fresh fruits and vegetables, whole grains, lean proteins, low-fat dairy, and heart-healthy fats.  Work with your health care provider or diet and nutrition specialist (dietitian) to adjust your  eating plan to your individual calorie needs. This information is not intended to replace advice given to you by your health care provider. Make sure you discuss any questions you have with your health care provider. Document Released: 03/12/2011 Document Revised: 03/16/2016 Document Reviewed: 03/16/2016 Elsevier Interactive Patient Education  [employer].

## 2017-07-01 ENCOUNTER — Other Ambulatory Visit (HOSPITAL_COMMUNITY)
Admission: RE | Admit: 2017-07-01 | Discharge: 2017-07-01 | Disposition: A | Discharge status: Home / Self Care | Payer: Commercial Managed Care - PPO | Source: Other Acute Inpatient Hospital | Attending: Family Medicine | Admitting: Family Medicine

## 2017-07-01 DIAGNOSIS — Z113 Encounter for screening for infections with a predominantly sexual mode of transmission: Secondary | ICD-10-CM | POA: Diagnosis present

## 2017-07-01 DIAGNOSIS — E559 Vitamin D deficiency, unspecified: Secondary | ICD-10-CM | POA: Insufficient documentation

## 2017-07-01 DIAGNOSIS — I10 Essential (primary) hypertension: Secondary | ICD-10-CM | POA: Diagnosis present

## 2017-07-01 LAB — URINE CYTOLOGY ANCILLARY ONLY
Chlamydia: NEGATIVE
Neisseria Gonorrhea: NEGATIVE
Trichomonas: NEGATIVE

## 2017-07-01 LAB — URINALYSIS, COMPLETE (UACMP) WITH MICROSCOPIC
BILIRUBIN URINE: NEGATIVE
Glucose, UA: NEGATIVE mg/dL
KETONES UR: NEGATIVE mg/dL
LEUKOCYTES UA: NEGATIVE
Nitrite: NEGATIVE
Protein, ur: NEGATIVE mg/dL
Specific Gravity, Urine: 1.024 (ref 1.005–1.030)
pH: 6 (ref 5.0–8.0)

## 2017-07-03 LAB — URINE CULTURE

## 2017-07-05 ENCOUNTER — Encounter: Payer: Self-pay | Admitting: Internal Medicine

## 2017-08-17 ENCOUNTER — Encounter: Payer: Commercial Managed Care - PPO | Admitting: Family Medicine

## 2017-09-10 ENCOUNTER — Encounter: Payer: Self-pay | Admitting: Family Medicine

## 2017-09-13 ENCOUNTER — Encounter: Payer: Self-pay | Admitting: Nurse Practitioner

## 2017-09-13 ENCOUNTER — Ambulatory Visit (INDEPENDENT_AMBULATORY_CARE_PROVIDER_SITE_OTHER): Payer: Commercial Managed Care - PPO | Admitting: Nurse Practitioner

## 2017-09-13 DIAGNOSIS — R140 Abdominal distension (gaseous): Secondary | ICD-10-CM | POA: Diagnosis not present

## 2017-09-13 DIAGNOSIS — K219 Gastro-esophageal reflux disease without esophagitis: Secondary | ICD-10-CM

## 2017-09-13 MED ORDER — OMEPRAZOLE 40 MG PO CPDR: 40.0000 mg | DELAYED_RELEASE_CAPSULE | Freq: Every day | ORAL | 3 refills | Status: DC

## 2017-09-13 NOTE — Assessment & Plan Note (Signed)
The patient describes significant GERD symptoms.  Occasional nausea, no vomiting.  GERD symptoms sometimes awakens her at night.  She has tried an over-the-counter acid blocker which is helped.  At this point I will start her on Prilosec 40 mg daily.  We will see if this helps her GERD as well as her bloating.  Return for follow-up in 2 months.  I requested she call us in 2 weeks and let us know if Prilosec is helping.  If not, we can change agents over the phone.

## 2017-09-13 NOTE — Assessment & Plan Note (Signed)
The patient describes bloating which is "high up" and points near her esophagus and stomach area.  It occurs after eating.  Worse with certain foods, specifically [place] food.  It seems like her bloating is associated with foods that tend to trigger GERD symptoms.  She is also having significant GERD.  There is a decent likelihood that her GERD symptoms are responsible for her bloating.  We will trial her on Prilosec as per above.  Follow-up in 2 months.  If her GERD symptoms are improved and bloating persist we can proceed with additional work-up.  She eventually may need an upper endoscopy and/or colonoscopy.  Of note, she is nearly due for first ever screening colonoscopy.

## 2017-09-13 NOTE — Progress Notes (Signed)
CC'D TO PCP °

## 2017-09-13 NOTE — Patient Instructions (Signed)
1. I have sent in Prilosec (omeprazole) 40 mg to your pharmacy.  Take this once a day, on an empty stomach. 2. Call us in 2 weeks and let us know if it is helping your heartburn symptoms and your "high up" bloating symptoms. 3. Return for follow-up in 2 months. 4. Call us if you have any questions or concerns  At [facility] we value your feedback. You may receive a survey about your visit today. Please share your experience as we strive to create trusting relationships with our patients to provide genuine, compassionate, quality care.  It was good to meet you today!  I hope you have a wonderful summer!!

## 2017-09-13 NOTE — Progress Notes (Signed)
Primary Care Physician:  Aliene BeamsHagler, Rachel, MD Primary Gastroenterologist:  Dr. Darrick PennaFields  Chief Complaint  Patient presents with  . Bloated    after eating  . Gastroesophageal Reflux    c/o 'lots of heartburn"    HPI:   Regina Castillo is a 47 y.o. female who presents on referral from primary care for abdominal bloating.  Reviewed last primary care visit dated 06/30/2017 which was the visit associated with her referral.  That time she was presenting to establish care.  During her visit she noted significant abdominal bloating and gas over the previous year, abdominal distention, occasional cramping after eating.  This particularly worse after certain foods including [place] food.  When she has dietary indiscretions she will have diarrhea within 15 minutes of her meal.  She has never had a colonoscopy before.  She started probiotics several months ago but no improvement.  Symptoms did not begin until her hysterectomy several years ago.  Symptoms seemed progressively worse over the previous months.  She eats out a lot of fast food multiple times a week since starting her job.  When she has been told she has IBS but there is no history of work-up or evaluation.  She was subsequently referred to GI.  Today she states she's doing ok overall. Her bloating is "high up" and notes she doesn't eat a lot, eats baked and broiled white meats; east minimal red meat. Bloating started about 2 years ago. Also with GERD over the last 2 years, seems to be progressively worsening. Symptoms occur about every other day. Denies dysphagia. Occasional nausea, no vomiting. Has been trying to make good dietary and life decisions to avoid triggers. Has taken OTC acid blocker which has helped. She has noted these changes since her hysterectomy. Moves her bowel regularly, consistent with [place] 4. Denies hematochezia, melena, fever, chills, unintentional weight loss.  Denies chest pain, dyspnea, dizziness, lightheadedness,  syncope, near syncope. Denies any other upper or lower GI symptoms.  At some point OB/GYN told her they think she has IBS.  Past Medical History:  Diagnosis Date  . Cyst of ovary, left   . Fatty liver   . Hypertension   . Irritable bowel syndrome    Never fully evaluated for this.    Past Surgical History:  Procedure Laterality Date  . ABDOMINAL HYSTERECTOMY    . CESAREAN SECTION      Current Outpatient Medications  Medication Sig Dispense Refill  . amLODipine (NORVASC) 5 MG tablet Take 5 mg by mouth daily.   3  . cetirizine (ZYRTEC) 10 MG tablet Take 10 mg by mouth daily.     No current facility-administered medications for this visit.     Allergies as of 09/13/2017  . (No Known Allergies)    Family History  Problem Relation Age of Onset  . Diabetes Father   . Diabetes Mother   . Hypertension Mother   . Colon cancer Neg Hx     Social History   Socioeconomic History  . Marital status: Single    Spouse name: Not on file  . Number of children: 3  . Years of education: Not on file  . Highest education level: Not on file  Occupational History  . Occupation: [employer]  Social Needs  . Financial resource strain: Not on file  . Food insecurity:    Worry: Never true    Inability: Never true  . Transportation needs:    Medical: No    Non-medical: No  Tobacco Use  . Smoking status: Current Every Day Smoker    Packs/day: 0.25    Types: Cigarettes  . Smokeless tobacco: Never Used  Substance and Sexual Activity  . Alcohol use: Yes    Comment: occasionally  . Drug use: No  . Sexual activity: Not Currently  Lifestyle  . Physical activity:    Days per week: Not on file    Minutes per session: Not on file  . Stress: Not on file  Relationships  . Social connections:    Talks on phone: Not on file    Gets together: Not on file    Attends religious service: Not on file    Active member of club or organization: Not on file    Attends meetings of clubs or  organizations: Not on file    Relationship status: Not on file  . Intimate partner violence:    Fear of current or ex partner: Not on file    Emotionally abused: Not on file    Physically abused: Not on file    Forced sexual activity: Not on file  Other Topics Concern  . Not on file  Social History Narrative   Works for [employer]. Single, not dating. Live with mother. Has grown children, 3 boys.  Eats meat fruits and vegetables.  Wears seatbelt.  Currently lives with mother.    Review of Systems: Complete ROS negative except as per HPI.   Physical Exam: BP (!) 159/96   Pulse 83   Temp 97.8 F (36.6 C) (Oral)   Ht 5' (1.524 m)   Wt 150 lb 9.6 oz (68.3 kg)   BMI 29.41 kg/m  General:   Alert and oriented. Pleasant and cooperative. Well-nourished and well-developed.  Eyes:  Without icterus, sclera clear and conjunctiva pink.  Ears:  Normal auditory acuity. Cardiovascular:  S1, S2 present without murmurs appreciated. Extremities without clubbing or edema. Respiratory:  Clear to auscultation bilaterally. No wheezes, rales, or rhonchi. No distress.  Gastrointestinal:  +BS, soft, non-tender and non-distended. No HSM noted. No guarding or rebound. No masses appreciated.  Rectal:  Deferred  Musculoskalatal:  Symmetrical without gross deformities. Skin:  Intact without significant lesions or rashes. Neurologic:  Alert and oriented x4;  grossly normal neurologically. Psych:  Alert and cooperative. Normal mood and affect. Heme/Lymph/Immune: No excessive bruising noted.    09/13/2017 11:55 AM   Disclaimer: This note was dictated with voice recognition software. Similar sounding words can inadvertently be transcribed and may not be corrected upon review.

## 2017-09-29 ENCOUNTER — Encounter: Payer: Commercial Managed Care - PPO | Admitting: Family Medicine

## 2017-10-26 ENCOUNTER — Telehealth: Payer: Self-pay | Admitting: Family Medicine

## 2017-10-26 ENCOUNTER — Encounter: Payer: Commercial Managed Care - PPO | Admitting: Family Medicine

## 2017-10-26 NOTE — Telephone Encounter (Signed)
Patient left voicemail to reschedule appt. Around 9am. Called patient back on requested # [phone]/ [phone]. No answer, left voicemail.

## 2017-12-07 ENCOUNTER — Ambulatory Visit (INDEPENDENT_AMBULATORY_CARE_PROVIDER_SITE_OTHER): Payer: Commercial Managed Care - PPO | Admitting: Obstetrics & Gynecology

## 2017-12-07 ENCOUNTER — Encounter: Payer: Self-pay | Admitting: Obstetrics & Gynecology

## 2017-12-07 VITALS — BP 158/100 | HR 82 | Ht 60.0 in | Wt 150.0 lb

## 2017-12-07 DIAGNOSIS — Z01419 Encounter for gynecological examination (general) (routine) without abnormal findings: Secondary | ICD-10-CM

## 2017-12-07 DIAGNOSIS — R6889 Other general symptoms and signs: Secondary | ICD-10-CM

## 2017-12-07 NOTE — Progress Notes (Signed)
Subjective:     Regina Castillo is a 47 y.o. female here for a routine exam.  No LMP recorded. Patient has had a hysterectomy. [id number] Birth Control Method:  hysterectomy Menstrual Calendar(currently): none  Current complaints: wants thyroid checked, also post prandiol bloating, discussed gallbladder disease and dysfunction, she wants to "clean" her bowels out first so recommended magnesium citrate.   Current acute medical issues:  hypertension   Recent Gynecologic History No LMP recorded. Patient has had a hysterectomy. Last Pap: ,   Last mammogram: 2018,  normal  Past Medical History:  Diagnosis Date  . Cyst of ovary, left   . Fatty liver   . Hypertension   . Irritable bowel syndrome    Never fully evaluated for this.    Past Surgical History:  Procedure Laterality Date  . ABDOMINAL HYSTERECTOMY    . CESAREAN SECTION      OB History    Gravida  3   Para  3   Term  3   Preterm      AB      Living  3     SAB      TAB      Ectopic      Multiple      Live Births  3           Social History   Socioeconomic History  . Marital status: Single    Spouse name: Not on file  . Number of children: 3  . Years of education: Not on file  . Highest education level: Not on file  Occupational History  . Occupation: [employer]  Social Needs  . Financial resource strain: Not on file  . Food insecurity:    Worry: Never true    Inability: Never true  . Transportation needs:    Medical: No    Non-medical: No  Tobacco Use  . Smoking status: Current Every Day Smoker    Packs/day: 0.25    Years: 15.00    Pack years: 3.75    Types: Cigarettes  . Smokeless tobacco: Never Used  Substance and Sexual Activity  . Alcohol use: Yes    Comment: occasionally  . Drug use: No  . Sexual activity: Not Currently    Birth control/protection: Surgical    Comment: hyst  Lifestyle  . Physical activity:    Days per week: Not on file    Minutes per session: Not on file  .  Stress: Not on file  Relationships  . Social connections:    Talks on phone: Not on file    Gets together: Not on file    Attends religious service: Not on file    Active member of club or organization: Not on file    Attends meetings of clubs or organizations: Not on file    Relationship status: Not on file  Other Topics Concern  . Not on file  Social History Narrative   Works for [employer]. Single, not dating. Live with mother. Has grown children, 3 boys.  Eats meat fruits and vegetables.  Wears seatbelt.  Currently lives with mother.    Family History  Problem Relation Age of Onset  . Diabetes Father   . Diabetes Mother   . Hypertension Mother   . Colon cancer Neg Hx      Current Outpatient Medications:  .  amLODipine (NORVASC) 5 MG tablet, Take 5 mg by mouth daily. , Disp: , Rfl: 3 .  cetirizine (ZYRTEC) 10 MG tablet,  Take 10 mg by mouth daily., Disp: , Rfl:  .  omeprazole (PRILOSEC) 40 MG capsule, Take 1 capsule (40 mg total) by mouth daily., Disp: 30 capsule, Rfl: 3 .  TURMERIC PO, Take by mouth. Twice a week, Disp: , Rfl:   Review of Systems  Review of Systems  Constitutional: Negative for fever, chills, weight loss, malaise/fatigue and diaphoresis.  HENT: Negative for hearing loss, ear pain, nosebleeds, congestion, sore throat, neck pain, tinnitus and ear discharge.   Eyes: Negative for blurred vision, double vision, photophobia, pain, discharge and redness.  Respiratory: Negative for cough, hemoptysis, sputum production, shortness of breath, wheezing and stridor.   Cardiovascular: Negative for chest pain, palpitations, orthopnea, claudication, leg swelling and PND.  Gastrointestinal: negative for abdominal pain. Negative for heartburn, nausea, vomiting, diarrhea, constipation, blood in stool and melena.  Genitourinary: Negative for dysuria, urgency, frequency, hematuria and flank pain.  Musculoskeletal: Negative for myalgias, back pain, joint pain and falls.  Skin:  Negative for itching and rash.  Neurological: Negative for dizziness, tingling, tremors, sensory change, speech change, focal weakness, seizures, loss of consciousness, weakness and headaches.  Endo/Heme/Allergies: Negative for environmental allergies and polydipsia. Does not bruise/bleed easily.  Psychiatric/Behavioral: Negative for depression, suicidal ideas, hallucinations, memory loss and substance abuse. The patient is not nervous/anxious and does not have insomnia.        Objective:  Blood pressure (!) 158/100, pulse 82, height 5' (1.524 m), weight 150 lb (68 kg).   Physical Exam  Vitals reviewed. Constitutional: She is oriented to person, place, and time. She appears well-developed and well-nourished.  HENT:  Head: Normocephalic and atraumatic.        Right Ear: External ear normal.  Left Ear: External ear normal.  Nose: Nose normal.  Mouth/Throat: Oropharynx is clear and moist.  Eyes: Conjunctivae and EOM are normal. Pupils are equal, round, and reactive to light. Right eye exhibits no discharge. Left eye exhibits no discharge. No scleral icterus.  Neck: Normal range of motion. Neck supple. No tracheal deviation present. No thyromegaly present.  Cardiovascular: Normal rate, regular rhythm, normal heart sounds and intact distal pulses.  Exam reveals no gallop and no friction rub.   No murmur heard. Respiratory: Effort normal and breath sounds normal. No respiratory distress. She has no wheezes. She has no rales. She exhibits no tenderness.  GI: Soft. Bowel sounds are normal. She exhibits no distension and no mass. There is no tenderness. There is no rebound and no guarding.  Genitourinary:  Breasts no masses skin changes or nipple changes bilaterally      Vulva is normal without lesions Vagina is pink moist without discharge Cervix absent Uterus absent Adnexa is negative with normal sized ovaries   Musculoskeletal: Normal range of motion. She exhibits no edema and no  tenderness.  Neurological: She is alert and oriented to person, place, and time. She has normal reflexes. She displays normal reflexes. No cranial nerve deficit. She exhibits normal muscle tone. Coordination normal.  Skin: Skin is warm and dry. No rash noted. No erythema. No pallor.  Psychiatric: She has a normal mood and affect. Her behavior is normal. Judgment and thought content normal.       Medications Ordered at today's visit: No orders of the defined types were placed in this encounter.   Other orders placed at today's visit: Orders Placed This Encounter  Procedures  . TSH      Assessment:    Healthy female exam.    Plan:  Mammogram ordered. Follow up in: 1 year.     Return in about 1 year (around 12/08/2018).

## 2017-12-07 NOTE — Patient Instructions (Signed)
Get magnesium citrate 2 bottles  Get a home blood pressure cuff

## 2017-12-08 LAB — TSH: TSH: 1.91 u[IU]/mL (ref 0.450–4.500)

## 2017-12-15 ENCOUNTER — Encounter: Payer: Self-pay | Admitting: Nurse Practitioner

## 2017-12-15 ENCOUNTER — Ambulatory Visit: Payer: Commercial Managed Care - PPO | Admitting: Nurse Practitioner

## 2018-03-21 ENCOUNTER — Ambulatory Visit: Payer: Commercial Managed Care - PPO | Admitting: Nurse Practitioner

## 2018-05-28 ENCOUNTER — Other Ambulatory Visit: Payer: Self-pay | Admitting: Nurse Practitioner

## 2018-05-28 DIAGNOSIS — R140 Abdominal distension (gaseous): Secondary | ICD-10-CM

## 2018-05-28 DIAGNOSIS — K219 Gastro-esophageal reflux disease without esophagitis: Secondary | ICD-10-CM

## 2018-05-31 ENCOUNTER — Other Ambulatory Visit: Payer: Self-pay

## 2018-05-31 DIAGNOSIS — R140 Abdominal distension (gaseous): Secondary | ICD-10-CM

## 2018-05-31 DIAGNOSIS — K219 Gastro-esophageal reflux disease without esophagitis: Secondary | ICD-10-CM

## 2018-05-31 MED ORDER — OMEPRAZOLE 40 MG PO CPDR: 40.0000 mg | DELAYED_RELEASE_CAPSULE | Freq: Every day | ORAL | 3 refills | Status: DC

## 2018-06-07 ENCOUNTER — Encounter: Payer: Self-pay | Admitting: Nurse Practitioner

## 2018-06-07 ENCOUNTER — Ambulatory Visit (INDEPENDENT_AMBULATORY_CARE_PROVIDER_SITE_OTHER): Payer: Commercial Managed Care - PPO | Admitting: Nurse Practitioner

## 2018-06-07 VITALS — BP 150/96 | HR 93 | Temp 97.2°F | Ht 60.0 in | Wt 149.8 lb

## 2018-06-07 DIAGNOSIS — R140 Abdominal distension (gaseous): Secondary | ICD-10-CM | POA: Diagnosis not present

## 2018-06-07 DIAGNOSIS — K219 Gastro-esophageal reflux disease without esophagitis: Secondary | ICD-10-CM

## 2018-06-07 NOTE — Addendum Note (Signed)
Addended by: Delane Ginger, ERIC A on: 06/07/2018 10:02 AM   Modules accepted: Orders

## 2018-06-07 NOTE — Assessment & Plan Note (Signed)
GERD symptoms generally well controlled on PPI.  Recommend she continue this for now.  Follow-up in 3 months.

## 2018-06-07 NOTE — Patient Instructions (Signed)
Your health issues we discussed today were:   GERD (heartburn): 1. Continue taking your acid blocker 2. Call us for any worsening symptoms  Bloating: 1. We will schedule your stomach emptying test 2. Start taking probiotics based on package recommendations for the next 2 to 3 months. 3. You can check which you have at home.  However, I will provide you with coupons if what you have at home is either expired or not what you thought 4. Call us for any severe symptoms  Overall I recommend:  1. Follow-up in 3 months 2. Call us if you have any questions or concerns  At [facility] we value your feedback. You may receive a survey about your visit today. Please share your experience as we strive to create trusting relationships with our patients to provide genuine, compassionate, quality care.  We appreciate your understanding and patience as we review any laboratory studies, imaging, and other diagnostic tests that are ordered as we care for you. Our office policy is 5 business days for review of these results, and any emergent or urgent results are addressed in a timely manner for your best interest. If you do not hear from our office in 1 week, please contact us.   We also encourage the use of MyChart, which contains your medical information for your review as well. If you are not enrolled in this feature, an access code is on this after visit summary for your convenience. Thank you for allowing [place] to be involved in your care.  It was great to see you today!  I hope you have a great day!!

## 2018-06-07 NOTE — Progress Notes (Addendum)
REVIEWED-NO ADDITIONAL RECOMMENDATIONS.   Referring Provider: Aliene Beams, MD Primary Care Physician:  Patient, No Pcp Per Primary GI:  Dr. Darrick Penna  Chief Complaint  Patient presents with  . Gastroesophageal Reflux    f/u  . Bloated    with eating still unchanged    HPI:   Regina Castillo is a 48 y.o. female who presents on follow-up for GERD.  The patient was last seen in our office 09/13/2017 for the same as well as bloating.  Noted symptoms for the previous year, worse with certain foods.  Diarrhea 15 minutes postprandial with dietary indiscretions.  No previous colonoscopy.  Probiotics not effective.  Abdominal bloating more epigastric/upper abdomen and associated with GERD both over the previous 2 years and progressive worsening.  Symptomatic every other day.  Occasional nausea.  Has been trying to make good dietary decisions to avoid triggers.  OTC acid blocker has helped.  Noted changes since her hysterectomy.  Regular bowel movements.  Recommended omeprazole 40 mg daily, progress report in 2 weeks, follow-up in 2 months.  The patient did not call with a progress report as requested  Today she states GERD symptoms are generally well controlled on current PPI (but has been out for a few days). However, does have significant bloating after eating "and it looks like I'm pregnant." Typically eats 3 times a day. Biggest meal is lunch. Bloating is upper abdominal. Unable to describe possible early satiety. Denies change in bowel movements, typically light tan. Has been eating more red meat than normal. Stools are solid/formed, no constipation. Denies hematochezia, melena but she's not sure (doesn't look often). Has been having increased anxiety at work. Denies fever, chills. Denies chest pain, dyspnea, dizziness, lightheadedness, syncope, near syncope. Denies any other upper or lower GI symptoms.  Past Medical History:  Diagnosis Date  . Cyst of ovary, left   . Fatty liver   .  Hypertension   . Irritable bowel syndrome    Never fully evaluated for this.    Past Surgical History:  Procedure Laterality Date  . ABDOMINAL HYSTERECTOMY    . CESAREAN SECTION      Current Outpatient Medications  Medication Sig Dispense Refill  . amLODipine (NORVASC) 5 MG tablet Take 5 mg by mouth daily.   3  . cetirizine (ZYRTEC) 10 MG tablet Take 10 mg by mouth daily.    Marland Kitchen omeprazole (PRILOSEC) 40 MG capsule Take 1 capsule (40 mg total) by mouth daily. 90 capsule 3  . TURMERIC PO Take by mouth. Twice a week     No current facility-administered medications for this visit.     Allergies as of 06/07/2018  . (No Known Allergies)    Family History  Problem Relation Age of Onset  . Diabetes Father   . Diabetes Mother   . Hypertension Mother   . Colon cancer Neg Hx     Social History   Socioeconomic History  . Marital status: Single    Spouse name: Not on file  . Number of children: 3  . Years of education: Not on file  . Highest education level: Not on file  Occupational History  . Occupation: [employer]  Social Needs  . Financial resource strain: Not on file  . Food insecurity:    Worry: Never true    Inability: Never true  . Transportation needs:    Medical: No    Non-medical: No  Tobacco Use  . Smoking status: Current Every Day Smoker  Packs/day: 0.25    Years: 15.00    Pack years: 3.75    Types: Cigarettes  . Smokeless tobacco: Never Used  Substance and Sexual Activity  . Alcohol use: Yes    Comment: occasionally  . Drug use: No  . Sexual activity: Not Currently    Birth control/protection: Surgical    Comment: hyst  Lifestyle  . Physical activity:    Days per week: Not on file    Minutes per session: Not on file  . Stress: Not on file  Relationships  . Social connections:    Talks on phone: Not on file    Gets together: Not on file    Attends religious service: Not on file    Active member of club or organization: Not on file    Attends  meetings of clubs or organizations: Not on file    Relationship status: Not on file  Other Topics Concern  . Not on file  Social History Narrative   Works for [employer]. Single, not dating. Live with mother. Has grown children, 3 boys.  Eats meat fruits and vegetables.  Wears seatbelt.  Currently lives with mother.    Review of Systems: Complete ROS negative except as per HPI.  Physical Exam: BP (!) 150/96   Pulse 93   Temp (!) 97.2 F (36.2 C) (Oral)   Ht 5' (1.524 m)   Wt 149 lb 12.8 oz (67.9 kg)   BMI 29.26 kg/m  General:   Alert and oriented. Pleasant and cooperative. Well-nourished and well-developed.  Eyes:  Without icterus, sclera clear and conjunctiva pink.  Ears:  Normal auditory acuity. Cardiovascular:  S1, S2 present without murmurs appreciated. Extremities without clubbing or edema. Respiratory:  Clear to auscultation bilaterally. No wheezes, rales, or rhonchi. No distress.  Gastrointestinal:  +BS, soft, non-tender and non-distended. No HSM noted. No guarding or rebound. No masses appreciated.  Rectal:  Deferred  Musculoskalatal:  Symmetrical without gross deformities. Neurologic:  Alert and oriented x4;  grossly normal neurologically. Psych:  Alert and cooperative. Normal mood and affect. Heme/Lymph/Immune: No excessive bruising noted.    06/07/2018 9:42 AM   Disclaimer: This note was dictated with voice recognition software. Similar sounding words can inadvertently be transcribed and may not be corrected upon review.

## 2018-06-07 NOTE — Assessment & Plan Note (Signed)
Persistent bloating in the upper abdomen despite better controlled GERD on PPI.  Diabetes does run in her family although she is not diagnosed with diabetes at this time.  We will check a gastric emptying test due to her complaints to see if delayed gastric emptying could be contributing to her symptoms.  Also start her on probiotic for 2 to 3 months.  Follow-up in 3 months to discuss further evaluation.  She is coming due for colonoscopy and this might be completed a year early for further evaluation.  Query need for upper endoscopy depending on persistence of symptoms and degree of symptoms.

## 2018-06-08 NOTE — Patient Instructions (Signed)
Called [facility], no PA needed for GES. Ref# [id number].

## 2018-06-09 ENCOUNTER — Encounter (HOSPITAL_COMMUNITY)
Admission: RE | Admit: 2018-06-09 | Discharge: 2018-06-09 | Disposition: A | Discharge status: Home / Self Care | Payer: Commercial Managed Care - PPO | Source: Ambulatory Visit | Attending: Nurse Practitioner | Admitting: Nurse Practitioner

## 2018-06-09 DIAGNOSIS — K219 Gastro-esophageal reflux disease without esophagitis: Secondary | ICD-10-CM

## 2018-06-09 DIAGNOSIS — R140 Abdominal distension (gaseous): Secondary | ICD-10-CM | POA: Diagnosis present

## 2018-06-09 MED ORDER — TECHNETIUM TC 99M SULFUR COLLOID
2.0000 | Freq: Once | INTRAVENOUS | Status: AC | PRN
  Administered 2018-06-09: 2.1 via ORAL

## 2018-06-15 ENCOUNTER — Telehealth: Payer: Self-pay | Admitting: Gastroenterology

## 2018-06-15 NOTE — Telephone Encounter (Signed)
Forwarding to Wynne Dust, NP who ordered the study.

## 2018-06-15 NOTE — Telephone Encounter (Signed)
Pt was calling to see if her Gastric Emptying Study results were available. Please call [phone]

## 2018-06-17 NOTE — Telephone Encounter (Signed)
See result note.  

## 2018-06-20 NOTE — Telephone Encounter (Signed)
PT is aware of result.

## 2018-06-20 NOTE — Progress Notes (Signed)
Pt is aware.  

## 2018-07-04 ENCOUNTER — Telehealth: Payer: Self-pay | Admitting: Adult Health

## 2018-07-04 NOTE — Telephone Encounter (Signed)
Patient called stating that she would like the a B12 medication called into her Pharmacy. Please contact pt

## 2018-07-04 NOTE — Telephone Encounter (Signed)
Pt wanted rx for vitamin d. Last prescribed 3 years ago. Advised patient that she would need labs. She can use otc vit d if she wants to.

## 2018-08-25 ENCOUNTER — Ambulatory Visit (INDEPENDENT_AMBULATORY_CARE_PROVIDER_SITE_OTHER): Payer: Commercial Managed Care - PPO | Admitting: Adult Health

## 2018-08-25 ENCOUNTER — Telehealth: Payer: Self-pay | Admitting: Adult Health

## 2018-08-25 ENCOUNTER — Other Ambulatory Visit: Payer: Self-pay | Admitting: Adult Health

## 2018-08-25 ENCOUNTER — Encounter: Payer: Self-pay | Admitting: Adult Health

## 2018-08-25 ENCOUNTER — Other Ambulatory Visit: Payer: Self-pay

## 2018-08-25 VITALS — BP 129/88 | HR 85 | Temp 98.5°F | Ht 60.0 in | Wt 150.0 lb

## 2018-08-25 DIAGNOSIS — R300 Dysuria: Secondary | ICD-10-CM

## 2018-08-25 DIAGNOSIS — N760 Acute vaginitis: Secondary | ICD-10-CM | POA: Diagnosis not present

## 2018-08-25 DIAGNOSIS — R319 Hematuria, unspecified: Secondary | ICD-10-CM | POA: Diagnosis not present

## 2018-08-25 DIAGNOSIS — B9689 Other specified bacterial agents as the cause of diseases classified elsewhere: Secondary | ICD-10-CM | POA: Diagnosis not present

## 2018-08-25 DIAGNOSIS — N898 Other specified noninflammatory disorders of vagina: Secondary | ICD-10-CM

## 2018-08-25 DIAGNOSIS — R3915 Urgency of urination: Secondary | ICD-10-CM | POA: Diagnosis not present

## 2018-08-25 LAB — POCT WET PREP (WET MOUNT)
Clue Cells Wet Prep Whiff POC: POSITIVE
WBC, Wet Prep HPF POC: POSITIVE

## 2018-08-25 LAB — POCT URINALYSIS DIPSTICK OB
Blood, UA: 2
Glucose, UA: NEGATIVE
Ketones, UA: NEGATIVE
Leukocytes, UA: NEGATIVE
Nitrite, UA: NEGATIVE

## 2018-08-25 MED ORDER — SECNIDAZOLE 2 G PO PACK: 2.0000 g | PACK | Freq: Once | ORAL | 0 refills | Status: AC

## 2018-08-25 MED ORDER — SULFAMETHOXAZOLE-TRIMETHOPRIM 800-160 MG PO TABS: 1.0000 | ORAL_TABLET | Freq: Two times a day (BID) | ORAL | 0 refills | Status: DC

## 2018-08-25 MED ORDER — METRONIDAZOLE 500 MG PO TABS: 500.0000 mg | ORAL_TABLET | Freq: Two times a day (BID) | ORAL | 0 refills | Status: DC

## 2018-08-25 NOTE — Telephone Encounter (Signed)
I called [facility] in [place] and as I was on the phone, the prescription came through. They have it in stock and pt can pick up tonight before 7. Pt aware. JSY

## 2018-08-25 NOTE — Patient Instructions (Signed)
Bacterial Vaginosis    Bacterial vaginosis is a vaginal infection that occurs when the normal balance of bacteria in the vagina is disrupted. It results from an overgrowth of certain bacteria. This is the most common vaginal infection among women ages 15-44.  Because bacterial vaginosis increases your risk for STIs (sexually transmitted infections), getting treated can help reduce your risk for chlamydia, gonorrhea, herpes, and HIV (human immunodeficiency virus). Treatment is also important for preventing complications in pregnant women, because this condition can cause an early (premature) delivery.  What are the causes?  This condition is caused by an increase in harmful bacteria that are normally present in small amounts in the vagina. However, the reason that the condition develops is not fully understood.  What increases the risk?  The following factors may make you more likely to develop this condition:  · Having a new sexual partner or multiple sexual partners.  · Having unprotected sex.  · Douching.  · Having an intrauterine device (IUD).  · Smoking.  · Drug and alcohol abuse.  · Taking certain antibiotic medicines.  · Being pregnant.  You cannot get bacterial vaginosis from toilet seats, bedding, swimming pools, or contact with objects around you.  What are the signs or symptoms?  Symptoms of this condition include:  · Grey or white vaginal discharge. The discharge can also be watery or foamy.  · A fish-like odor with discharge, especially after sexual intercourse or during menstruation.  · Itching in and around the vagina.  · Burning or pain with urination.  Some women with bacterial vaginosis have no signs or symptoms.  How is this diagnosed?  This condition is diagnosed based on:  · Your medical history.  · A physical exam of the vagina.  · Testing a sample of vaginal fluid under a microscope to look for a large amount of bad bacteria or abnormal cells. Your health care provider may use a cotton swab or  a small wooden spatula to collect the sample.  How is this treated?  This condition is treated with antibiotics. These may be given as a pill, a vaginal cream, or a medicine that is put into the vagina (suppository). If the condition comes back after treatment, a second round of antibiotics may be needed.  Follow these instructions at home:  Medicines  · Take over-the-counter and prescription medicines only as told by your health care provider.  · Take or use your antibiotic as told by your health care provider. Do not stop taking or using the antibiotic even if you start to feel better.  General instructions  · If you have a female sexual partner, tell her that you have a vaginal infection. She should see her health care provider and be treated if she has symptoms. If you have a female sexual partner, he does not need treatment.  · During treatment:  ? Avoid sexual activity until you finish treatment.  ? Do not douche.  ? Avoid alcohol as directed by your health care provider.  ? Avoid breastfeeding as directed by your health care provider.  · Drink enough water and fluids to keep your urine clear or pale yellow.  · Keep the area around your vagina and rectum clean.  ? Wash the area daily with warm water.  ? Wipe yourself from front to back after using the toilet.  · Keep all follow-up visits as told by your health care provider. This is important.  How is this prevented?  · Do not   douche.  · Wash the outside of your vagina with warm water only.  · Use protection when having sex. This includes latex condoms and dental dams.  · Limit how many sexual partners you have. To help prevent bacterial vaginosis, it is best to have sex with just one partner (monogamous).  · Make sure you and your sexual partner are tested for STIs.  · Wear cotton or cotton-lined underwear.  · Avoid wearing tight pants and pantyhose, especially during summer.  · Limit the amount of alcohol that you drink.  · Do not use any products that contain  nicotine or tobacco, such as cigarettes and e-cigarettes. If you need help quitting, ask your health care provider.  · Do not use illegal drugs.  Where to find more information  · Centers for Disease Control and Prevention: www.cdc.gov/std  · American Sexual Health Association (ASHA): www.ashastd.org  · U.S. Department of Health and Human Services, Office on Women's Health: www.womenshealth.gov/ or https://www.womenshealth.gov/a-z-topics/bacterial-vaginosis  Contact a health care provider if:  · Your symptoms do not improve, even after treatment.  · You have more discharge or pain when urinating.  · You have a fever.  · You have pain in your abdomen.  · You have pain during sex.  · You have vaginal bleeding between periods.  Summary  · Bacterial vaginosis is a vaginal infection that occurs when the normal balance of bacteria in the vagina is disrupted.  · Because bacterial vaginosis increases your risk for STIs (sexually transmitted infections), getting treated can help reduce your risk for chlamydia, gonorrhea, herpes, and HIV (human immunodeficiency virus). Treatment is also important for preventing complications in pregnant women, because the condition can cause an early (premature) delivery.  · This condition is treated with antibiotic medicines. These may be given as a pill, a vaginal cream, or a medicine that is put into the vagina (suppository).  This information is not intended to replace advice given to you by your health care provider. Make sure you discuss any questions you have with your health care provider.  Document Released: 03/23/2005 Document Revised: 07/27/2016 Document Reviewed: 12/07/2015  Elsevier Interactive Patient Education © 2019 [employer] Inc.

## 2018-08-25 NOTE — Progress Notes (Signed)
Rx solosec

## 2018-08-25 NOTE — Telephone Encounter (Signed)
About the meds that were called in for her only 1 set is there

## 2018-08-25 NOTE — Progress Notes (Signed)
Patient ID: BREONDA DELUCCIA, female   DOB: 1971/03/15, 48 y.o.   MRN: [id number] History of Present Illness: Kinzli is a 48 year old black female, sp hysterectomy in complaining of burning with urination and frequency, and some UI for several days. She works at [employer]. No PCP.    Current Medications, Allergies, Past Medical History, Past Surgical History, Family History and Social History were reviewed in [employer] record.     Review of Systems: +dysuria  +urinary frequency and some UI Has jus resumed having sex, has new partner.    Physical Exam:BP 129/88 (BP Location: Right Arm, Patient Position: Sitting, Cuff Size: Normal)   Pulse 85   Temp 98.5 F (36.9 C)   Ht 5' (1.524 m)   Wt 150 lb (68 kg)   BMI 29.29 kg/m  urine 2+blood, 2+ protein General:  Well developed, well nourished, no acute distress Skin:  Warm and dry Pelvic:  External genitalia is normal in appearance, no lesions.  The vagina is normal in appearance, has creamy discharge with odor. Urethra has no lesions or masses. The cervix and uterus are absent. No adnexal masses or tenderness noted.Bladder is non tender, no masses felt. Nuswab obtained, wet prep: +WBCs, +clues cells  Psych:  No mood changes, alert and cooperative,seems happy Fall risk is low. Examination chaperoned by Malachy Mood LPN.    Impression and Plan:  1. Urgency of urination - POC Urinalysis Dipstick OB -urine culture sent and will treat for UTI   2. Hematuria, unspecified type - Urine Culture, will talk when results back  - Urinalysis, Routine w reflex microscopic -Will rx septra ds 1 bid for 7 days  -push fluids Meds ordered this encounter  Medications  . metroNIDAZOLE (FLAGYL) 500 MG tablet    Sig: Take 1 tablet (500 mg total) by mouth 2 (two) times daily.    Dispense:  14 tablet    Refill:  0    Order Specific Question:   Supervising Provider    Answer:   Despina Hidden, LUTHER H [[id number]]  .  sulfamethoxazole-trimethoprim (BACTRIM DS) 800-160 MG tablet    Sig: Take 1 tablet by mouth 2 (two) times daily. Take 1 bid    Dispense:  14 tablet    Refill:  0    Order Specific Question:   Supervising Provider    Answer:   Despina Hidden, LUTHER H [[id number]]  F/U prn   3. Dysuria  4. Vaginal odor  - NuSwab Vaginitis Plus (VG+) - POCT Wet Prep ([place])  5. BV (bacterial vaginosis) Will rx flagyl 500 mg 1 bid x 7 days, no alcohol or sex -review handout on BV - NuSwab Vaginitis Plus (VG+) - POCT Wet Prep [place])

## 2018-08-26 LAB — URINALYSIS, ROUTINE W REFLEX MICROSCOPIC
Bilirubin, UA: NEGATIVE
Glucose, UA: NEGATIVE
Nitrite, UA: NEGATIVE
Specific Gravity, UA: 1.024 (ref 1.005–1.030)
Urobilinogen, Ur: 0.2 mg/dL (ref 0.2–1.0)
pH, UA: 6 (ref 5.0–7.5)

## 2018-08-26 LAB — MICROSCOPIC EXAMINATION: Casts: NONE SEEN /lpf

## 2018-08-27 LAB — URINE CULTURE

## 2018-08-30 ENCOUNTER — Telehealth: Payer: Self-pay | Admitting: Adult Health

## 2018-08-30 NOTE — Telephone Encounter (Signed)
Left message that septra ds should take care of + E coli in urine

## 2018-09-01 ENCOUNTER — Telehealth: Payer: Self-pay | Admitting: Adult Health

## 2018-09-01 MED ORDER — FLUCONAZOLE 150 MG PO TABS: ORAL_TABLET | ORAL | 1 refills | Status: DC

## 2018-09-01 NOTE — Telephone Encounter (Signed)
rx diflucan  

## 2018-09-01 NOTE — Telephone Encounter (Signed)
Pt is requesting Diflucan. Has been on antibiotics. Pt has some irritation. Please advise. Thanks!! JSY

## 2018-09-01 NOTE — Telephone Encounter (Signed)
Pt requesting diflucan to be sent in to help prevent a yeast infection due to her being on antibiotics.

## 2018-09-07 ENCOUNTER — Ambulatory Visit: Payer: Commercial Managed Care - PPO | Admitting: Nurse Practitioner

## 2018-09-08 LAB — NUSWAB VAGINITIS PLUS (VG+)
Atopobium vaginae: HIGH Score — AB
BVAB 2: HIGH Score — AB
Candida albicans, NAA: POSITIVE — AB
Candida glabrata, NAA: NEGATIVE
Chlamydia trachomatis, NAA: NEGATIVE
Megasphaera 1: HIGH Score — AB
Neisseria gonorrhoeae, NAA: NEGATIVE
Trich vag by NAA: NEGATIVE

## 2018-09-09 ENCOUNTER — Telehealth: Payer: Self-pay | Admitting: Adult Health

## 2018-09-09 NOTE — Telephone Encounter (Signed)
Pt already treated for BV and yeast, and is feeling better.She is aware that BV and yeast was all that was on Nuswab

## 2018-09-12 ENCOUNTER — Encounter: Payer: Self-pay | Admitting: Gastroenterology

## 2018-09-12 ENCOUNTER — Ambulatory Visit: Payer: Commercial Managed Care - PPO | Admitting: Nurse Practitioner

## 2018-09-12 NOTE — Progress Notes (Deleted)
Referring Provider: No ref. provider found Primary Care Physician:  Patient, No Pcp Per Primary GI:  Dr. Darrick PennaFields  No chief complaint on file.   HPI:   Regina Castillo is a 48 y.o. female who presents for follow-up on bloating.  Patient was last seen in our office 06/07/2026 for GERD, bloating, gaseous abdominal distention.  Previously noted bloating, diarrhea 15 minutes postprandial with dietary indiscretions.  Probiotics not helpful.  Bloating more significant upper abdomen with occasional nausea.  OTC acid blocker has helped some.  She was started on omeprazole 40 mg daily.  At her last visit noted GERD generally well controlled on PPI but significant bloating after eating "and it looks like I am pregnant."  Typically eats 3 times a day with the biggest meal being the lunch.  No constipation, no other red flag/warning signs or symptoms.  Has had increased anxiety at work.  Recommended continue PPI, gastric emptying test, probiotics, follow-up in 3 months.  GES completed 06/09/2018 found to be normal.  Today she states   Past Medical History:  Diagnosis Date  . Cyst of ovary, left   . Fatty liver   . Hypertension   . Irritable bowel syndrome    Never fully evaluated for this.    Past Surgical History:  Procedure Laterality Date  . ABDOMINAL HYSTERECTOMY    . CESAREAN SECTION      Current Outpatient Medications  Medication Sig Dispense Refill  . amLODipine (NORVASC) 5 MG tablet Take 5 mg by mouth daily.   3  . cetirizine (ZYRTEC) 10 MG tablet Take 10 mg by mouth daily.    . fluconazole (DIFLUCAN) 150 MG tablet Take 1 now and repeat 1 in 3 days 2 tablet 1  . metroNIDAZOLE (FLAGYL) 500 MG tablet Take 1 tablet (500 mg total) by mouth 2 (two) times daily. 14 tablet 0  . Multiple Vitamins-Minerals (MULTI-VITAMIN GUMMIES PO) Take by mouth.    Marland Kitchen. omeprazole (PRILOSEC) 40 MG capsule Take 1 capsule (40 mg total) by mouth daily. 90 capsule 3  . sulfamethoxazole-trimethoprim (BACTRIM  DS) 800-160 MG tablet Take 1 tablet by mouth 2 (two) times daily. Take 1 bid 14 tablet 0  . TURMERIC PO Take by mouth. Twice a week     No current facility-administered medications for this visit.     Allergies as of 09/12/2018  . (No Known Allergies)    Family History  Problem Relation Age of Onset  . Diabetes Father   . Diabetes Mother   . Hypertension Mother   . Colon cancer Neg Hx     Social History   Socioeconomic History  . Marital status: Single    Spouse name: Not on file  . Number of children: 3  . Years of education: Not on file  . Highest education level: Not on file  Occupational History  . Occupation: [employer]  Social Needs  . Financial resource strain: Not on file  . Food insecurity:    Worry: Never true    Inability: Never true  . Transportation needs:    Medical: No    Non-medical: No  Tobacco Use  . Smoking status: Current Every Day Smoker    Packs/day: 0.25    Years: 15.00    Pack years: 3.75    Types: Cigarettes  . Smokeless tobacco: Never Used  Substance and Sexual Activity  . Alcohol use: Yes    Comment: occasionally  . Drug use: No  . Sexual activity: Yes  Birth control/protection: Surgical    Comment: hyst  Lifestyle  . Physical activity:    Days per week: Not on file    Minutes per session: Not on file  . Stress: Not on file  Relationships  . Social connections:    Talks on phone: Not on file    Gets together: Not on file    Attends religious service: Not on file    Active member of club or organization: Not on file    Attends meetings of clubs or organizations: Not on file    Relationship status: Not on file  Other Topics Concern  . Not on file  Social History Narrative   Works for [employer] Single, not dating. Live with mother. Has grown children, 3 boys.  Eats meat fruits and vegetables.  Wears seatbelt.  Currently lives with mother.    Review of Systems: Complete ROS negative except as per HPI.  Physical Exam: There were no  vitals taken for this visit. General:   Alert and oriented. Pleasant and cooperative. Well-nourished and well-developed.  Head:  Normocephalic and atraumatic. Eyes:  Without icterus, sclera clear and conjunctiva pink.  Ears:  Normal auditory acuity. Mouth:  No deformity or lesions, oral mucosa pink.  Throat/Neck:  Supple, without mass or thyromegaly. Cardiovascular:  S1, S2 present without murmurs appreciated. Normal pulses noted. Extremities without clubbing or edema. Respiratory:  Clear to auscultation bilaterally. No wheezes, rales, or rhonchi. No distress.  Gastrointestinal:  +BS, soft, non-tender and non-distended. No HSM noted. No guarding or rebound. No masses appreciated.  Rectal:  Deferred  Musculoskalatal:  Symmetrical without gross deformities. Normal posture. Skin:  Intact without significant lesions or rashes. Neurologic:  Alert and oriented x4;  grossly normal neurologically. Psych:  Alert and cooperative. Normal mood and affect. Heme/Lymph/Immune: No significant cervical adenopathy. No excessive bruising noted.    09/12/2018 8:28 AM   Disclaimer: This note was dictated with voice recognition software. Similar sounding words can inadvertently be transcribed and may not be corrected upon review.

## 2018-11-24 ENCOUNTER — Other Ambulatory Visit: Payer: Self-pay

## 2018-11-24 ENCOUNTER — Other Ambulatory Visit: Payer: Self-pay | Admitting: Nurse Practitioner

## 2018-11-24 ENCOUNTER — Ambulatory Visit (INDEPENDENT_AMBULATORY_CARE_PROVIDER_SITE_OTHER): Payer: Commercial Managed Care - PPO | Admitting: Nurse Practitioner

## 2018-11-24 ENCOUNTER — Encounter: Payer: Self-pay | Admitting: Nurse Practitioner

## 2018-11-24 VITALS — BP 157/96 | HR 77 | Temp 97.0°F | Ht 60.0 in | Wt 145.2 lb

## 2018-11-24 DIAGNOSIS — K589 Irritable bowel syndrome without diarrhea: Secondary | ICD-10-CM

## 2018-11-24 DIAGNOSIS — K219 Gastro-esophageal reflux disease without esophagitis: Secondary | ICD-10-CM | POA: Diagnosis not present

## 2018-11-24 DIAGNOSIS — R140 Abdominal distension (gaseous): Secondary | ICD-10-CM

## 2018-11-24 NOTE — Assessment & Plan Note (Addendum)
Bloating is improved but not resolved.  She does have a history of IBS but is generally well controlled.  She consumes very minimal dairy, typically just a splash of cream when she does occasionally has coffee.  At this point I will check a celiac panel to check for gluten sensitivity.  We could also consider evaluation for small intestinal bacterial overgrowth, although we do not have testing capability at this point.  We could trial antibiotic regimen for SIBO if her celiac panel is normal to see if this makes any improvement.  Return for follow-up in 3 months, further recommendations to follow.

## 2018-11-24 NOTE — Assessment & Plan Note (Signed)
History of IBS but currently without symptoms other than bloating.  Has regular bowel movements and no constipation.  Continue current medications and follow-up in 3 months.

## 2018-11-24 NOTE — Patient Instructions (Signed)
Your health issues we discussed today were:   Persistent bloating: 1. Have your blood test completed when you are able to 2. We will call you with results. 3. Depending on the results we can make further recommendations 4. Call us if you have any worsening or severe symptoms.  Overall I recommend:  1. Continue your current medications 2. Return for follow-up in 3 months 3. Call us if you have any questions or concerns.   Because of recent events of COVID-19 ("Coronavirus"), follow CDC recommendations:  1. Wash your hand frequently 2. Avoid touching your face 3. Stay away from people who are sick 4. If you have symptoms such as fever, cough, shortness of breath then call your healthcare provider for further guidance 5. If you are sick, STAY AT HOME unless otherwise directed by your healthcare provider. 6. Follow directions from state and national officials regarding staying safe   At [facility] we value your feedback. You may receive a survey about your visit today. Please share your experience as we strive to create trusting relationships with our patients to provide genuine, compassionate, quality care.  We appreciate your understanding and patience as we review any laboratory studies, imaging, and other diagnostic tests that are ordered as we care for you. Our office policy is 5 business days for review of these results, and any emergent or urgent results are addressed in a timely manner for your best interest. If you do not hear from our office in 1 week, please contact us.   We also encourage the use of MyChart, which contains your medical information for your review as well. If you are not enrolled in this feature, an access code is on this after visit summary for your convenience. Thank you for allowing [place] to be involved in your care.  It was great to see you today!  I hope you have a great summer!!

## 2018-11-24 NOTE — Progress Notes (Signed)
Referring Provider: No ref. provider found Primary Care Physician:  Patient, No Pcp Per Primary GI:  Dr. Oneida Alar  Chief Complaint  Patient presents with   Bloated   Gastroesophageal Reflux    HPI:   Regina Castillo is a 48 y.o. female who presents with bloating.  The patient was last seen in our office 06/07/2018 for GERD, bloating, abdominal distention.  Noted no previous colonoscopy.  Similar symptoms previously in addition to diarrhea postprandial.  Probiotics not effective.  Bloating more epigastric/upper abdomen and associated GERD for the previous 2 years and progressive worsening.  Has tried changing her diet and avoiding dietary indiscretions.  OTC acid blockers helped.  Previously recommended to start omeprazole.  At her last visit she stated her GERD was generally well controlled on PPI.  Still with significant bloating after eating" it looks like I am pregnant."  Typically eats 3 times a day with the biggest meal being lunch.  Her bloating is upper abdomen and unable to describe possible early satiety.  Has been eating more red meat than normal.  No constipation.  No other significant GI symptoms.  Plan to continue PPI, gastric emptying test, probiotics, follow-up in 3 months.  GES completed 06/09/2018 which found normal gastric emptying study.  Today she states she's doing well overall. Has changed her diet, started probiotic. Is almost up to 4 miles a day walking. Has lost some intentional weight through change in diet/exercise. She is having regular bowel movement, no constipation. Has persistent bloating, but seems somewhat better. Sometimes appears she is pregnant. Typically occurs about 30 mins after eating. Consumes minimal to no dairy with occasional cream in her coffee. Denies abdominal pain, N/V, hematochezia, melena, fever, chills, unintentional weight loss. Denies URI or flu-like symptoms. Denies loss of sense of taste or smell. Denies chest pain, dyspnea, dizziness,  lightheadedness, syncope, near syncope. Denies any other upper or lower GI symptoms.   Past Medical History:  Diagnosis Date   Cyst of ovary, left    Fatty liver    Hypertension    Irritable bowel syndrome    Never fully evaluated for this.    Past Surgical History:  Procedure Laterality Date   ABDOMINAL HYSTERECTOMY     CESAREAN SECTION      Current Outpatient Medications  Medication Sig Dispense Refill   amLODipine (NORVASC) 5 MG tablet Take 5 mg by mouth daily.   3   cetirizine (ZYRTEC) 10 MG tablet Take 10 mg by mouth as needed.      lactobacillus acidophilus (BACID) TABS tablet Take 1 tablet by mouth daily.     omeprazole (PRILOSEC) 40 MG capsule Take 1 capsule (40 mg total) by mouth daily. 90 capsule 3   TURMERIC PO Take by mouth. Twice a week     No current facility-administered medications for this visit.     Allergies as of 11/24/2018   (No Known Allergies)    Family History  Problem Relation Age of Onset   Diabetes Father    Diabetes Mother    Hypertension Mother    Colon cancer Neg Hx     Social History   Socioeconomic History   Marital status: Single    Spouse name: Not on file   Number of children: 3   Years of education: Not on file   Highest education level: Not on file  Occupational History   Occupation: [employer]  Social Needs   Financial resource strain: Not on file   Food insecurity  Worry: Never true    Inability: Never true   Transportation needs    Medical: No    Non-medical: No  Tobacco Use   Smoking status: Current Every Day Smoker    Packs/day: 0.25    Years: 15.00    Pack years: 3.75    Types: Cigarettes   Smokeless tobacco: Never Used  Substance and Sexual Activity   Alcohol use: Yes    Comment: occasionally   Drug use: No   Sexual activity: Yes    Birth control/protection: Surgical    Comment: hyst  Lifestyle   Physical activity    Days per week: Not on file    Minutes per session: Not on  file   Stress: Not on file  Relationships   Social connections    Talks on phone: Not on file    Gets together: Not on file    Attends religious service: Not on file    Active member of club or organization: Not on file    Attends meetings of clubs or organizations: Not on file    Relationship status: Not on file  Other Topics Concern   Not on file  Social History Narrative   Works for [employer]. Single, not dating. Live with mother. Has grown children, 3 boys.  Eats meat fruits and vegetables.  Wears seatbelt.  Currently lives with mother.    Review of Systems: Complete ROS negative except as per HPI.   Physical Exam: BP (!) 157/96    Pulse 77    Temp (!) 97 F (36.1 C) (Oral)    Ht 5' (1.524 m)    Wt 145 lb 3.2 oz (65.9 kg)    BMI 28.36 kg/m  General:   Alert and oriented. Pleasant and cooperative. Well-nourished and well-developed.  Eyes:  Without icterus, sclera clear and conjunctiva pink.  Ears:  Normal auditory acuity. Cardiovascular:  S1, S2 present without murmurs appreciated. Extremities without clubbing or edema. Respiratory:  Clear to auscultation bilaterally. No wheezes, rales, or rhonchi. No distress.  Gastrointestinal:  +BS, soft, non-tender and non-distended. No HSM noted. No guarding or rebound. No masses appreciated.  Rectal:  Deferred  Musculoskalatal:  Symmetrical without gross deformities. Neurologic:  Alert and oriented x4;  grossly normal neurologically. Psych:  Alert and cooperative. Normal mood and affect. Heme/Lymph/Immune: No excessive bruising noted.    11/24/2018 10:04 AM   Disclaimer: This note was dictated with voice recognition software. Similar sounding words can inadvertently be transcribed and may not be corrected upon review.

## 2018-11-24 NOTE — Assessment & Plan Note (Signed)
GERD currently well managed on PPI.  Recommend she continue her medications.  She does have some persistent bloating despite adequate GERD management.  Follow-up in 3 months.  Further work-up of bloating as per below.

## 2018-11-25 LAB — CELIAC DISEASE COMPREHENSIVE PANEL WITH REFLEXES
(tTG) Ab, IgA: 1 U/mL
Immunoglobulin A: 170 mg/dL (ref 47–310)

## 2018-11-28 NOTE — Progress Notes (Signed)
No pcp per patient 

## 2019-02-21 ENCOUNTER — Ambulatory Visit: Payer: Commercial Managed Care - PPO | Admitting: Nurse Practitioner

## 2019-02-22 ENCOUNTER — Other Ambulatory Visit: Payer: Self-pay

## 2019-02-22 ENCOUNTER — Ambulatory Visit (INDEPENDENT_AMBULATORY_CARE_PROVIDER_SITE_OTHER): Payer: Commercial Managed Care - PPO | Admitting: Nurse Practitioner

## 2019-02-22 ENCOUNTER — Encounter: Payer: Self-pay | Admitting: Nurse Practitioner

## 2019-02-22 VITALS — BP 158/96 | HR 82 | Temp 96.0°F | Ht 60.0 in | Wt 145.2 lb

## 2019-02-22 DIAGNOSIS — K219 Gastro-esophageal reflux disease without esophagitis: Secondary | ICD-10-CM | POA: Diagnosis not present

## 2019-02-22 DIAGNOSIS — R140 Abdominal distension (gaseous): Secondary | ICD-10-CM | POA: Diagnosis not present

## 2019-02-22 DIAGNOSIS — R142 Eructation: Secondary | ICD-10-CM

## 2019-02-22 NOTE — Assessment & Plan Note (Signed)
Describes new onset of excessive belching.  This started after she stopped taking her PPI daily.  She insists that this is not related to her PPI because she has never had belching like this before and feels it is a new symptom.  She is wanting to be "put in the machine" to make sure it is not an ulcer or hernia.  I discussed the low likelihood insurance would pay for CT scan for belching.  No overt or red flag symptoms such as persistent nausea vomiting, severe upper abdominal pain or left upper quadrant pain to justify endoscopic evaluation at this time, especially since she is not taking her medication regularly.  I discussed that belching is often a frequent symptom of either uncontrolled reflux, foods/beverages with gas producing properties such as sodas or cruciferous vegetables, or inadvertent swallowing of air.  I recommended she restart her probiotic and her PPI once daily.  Call for any worsening or severe symptoms.  We will follow-up in 2 months to see how her symptoms are doing on a regular PPI.

## 2019-02-22 NOTE — Progress Notes (Signed)
Referring Provider: No ref. provider found Primary Care Physician:  Patient, No Pcp Per Primary GI:  Dr. Darrick Penna  Chief Complaint  Patient presents with  . Gastroesophageal Reflux    burping  . Bloated    HPI:   Regina Castillo is a 48 y.o. female who presents for follow-up on GERD and bloating.  The patient was last seen in our office 11/24/2018 for GERD, IBS, bloating.  No previous colonoscopy, probiotics for diarrhea ineffective. GERD with associated bloating for the past 2 years and progressive worsening has changed diet and trigger avoidance.  She did have some improvement initially with omeprazole.  Query gastroparesis with GES completed 06/09/2018 found normal gastric emptying.  At her last visit she noted she has changed her diet and restarted the probiotic.  She is walking much more up to 4 miles a day with intentional weight loss.  Regular bowel movements without constipation, persistent bloating but somewhat improved.  This typically occurs 30 minutes after eating.  Minimal to no dairy.  No other overt GI symptoms.  Recommended celiac panel and follow-up in 3 months.  Celiac panel was normal.  Recommended call for any worsening symptoms.  Keep follow-up appointment.  Today she states she's doing ok overall. Stopped taking PPI daily and only takes it if she is going to eat something that she knows is a GERD trigger. She was told by a coworker felt her symptoms were anxiety. She has been out of her probiotic. Since stopping PPI is having more belching. The belching has been going for a week or so. Bloating has gotten better with exercise and diet changes. States she feels like something is inside her. Wants an ulcer and hernia ruled out. Denies esophageal burning. Can't tell if there's a bitter taste. Denies hematochezia, melena, N/V, fever, chills, unintentional weight loss. Denies URI or flu-like symptoms. Denies loss of sense of taste or smell. However, she has sinus issues all year  long. Denies chest pain, dyspnea, dizziness, lightheadedness, syncope, near syncope. Denies any other upper or lower GI symptoms.  Has cut back on sodas. Is drinking more coffee.  Has a bowel movement daily, empties completely. States she doesn't like to take medication much because "when you shut down your body it's hard to know what your body is really feeling."  Past Medical History:  Diagnosis Date  . Cyst of ovary, left   . Fatty liver   . Hypertension   . Irritable bowel syndrome    Never fully evaluated for this.    Past Surgical History:  Procedure Laterality Date  . ABDOMINAL HYSTERECTOMY    . CESAREAN SECTION      Current Outpatient Medications  Medication Sig Dispense Refill  . amLODipine (NORVASC) 5 MG tablet Take 5 mg by mouth daily.   3  . cetirizine (ZYRTEC) 10 MG tablet Take 10 mg by mouth as needed.     Marland Kitchen omeprazole (PRILOSEC) 40 MG capsule Take 1 capsule (40 mg total) by mouth daily. (Patient taking differently: Take 40 mg by mouth as needed. ) 90 capsule 3   No current facility-administered medications for this visit.     Allergies as of 02/22/2019  . (No Known Allergies)    Family History  Problem Relation Age of Onset  . Diabetes Father   . Diabetes Mother   . Hypertension Mother   . Colon cancer Neg Hx     Social History   Socioeconomic History  . Marital status: Single  Spouse name: Not on file  . Number of children: 3  . Years of education: Not on file  . Highest education level: Not on file  Occupational History  . Occupation: [employer]  Social Needs  . Financial resource strain: Not on file  . Food insecurity    Worry: Never true    Inability: Never true  . Transportation needs    Medical: No    Non-medical: No  Tobacco Use  . Smoking status: Current Every Day Smoker    Packs/day: 0.25    Years: 15.00    Pack years: 3.75    Types: Cigarettes  . Smokeless tobacco: Never Used  Substance and Sexual Activity  . Alcohol use: Yes     Comment: occasionally  . Drug use: No  . Sexual activity: Yes    Birth control/protection: Surgical    Comment: hyst  Lifestyle  . Physical activity    Days per week: Not on file    Minutes per session: Not on file  . Stress: Not on file  Relationships  . Social Musicianconnections    Talks on phone: Not on file    Gets together: Not on file    Attends religious service: Not on file    Active member of club or organization: Not on file    Attends meetings of clubs or organizations: Not on file    Relationship status: Not on file  Other Topics Concern  . Not on file  Social History Narrative   Works for [employer]. Single, not dating. Live with mother. Has grown children, 3 boys.  Eats meat fruits and vegetables.  Wears seatbelt.  Currently lives with mother.    Review of Systems: General: Negative for anorexia, weight loss, fever, chills, fatigue, weakness. Eyes: Negative for vision changes.  ENT: Negative for hoarseness, difficulty swallowing , nasal congestion. CV: Negative for chest pain, angina, palpitations, dyspnea on exertion, peripheral edema.  Respiratory: Negative for dyspnea at rest, dyspnea on exertion, cough, sputum, wheezing.  GI: See history of present illness. GU:  Negative for dysuria, hematuria, urinary incontinence, urinary frequency, nocturnal urination.  MS: Negative for joint pain, low back pain.  Derm: Negative for rash or itching.  Neuro: Negative for weakness, abnormal sensation, seizure, frequent headaches, memory loss, confusion.  Psych: Negative for anxiety, depression, suicidal ideation, hallucinations.  Endo: Negative for unusual weight change.  Heme: Negative for bruising or bleeding. Allergy: Negative for rash or hives.   Physical Exam: BP (!) 158/96   Pulse 82   Temp (!) 96 F (35.6 C) (Temporal)   Ht 5' (1.524 m)   Wt 145 lb 3.2 oz (65.9 kg)   BMI 28.36 kg/m  General:   Alert and oriented. Pleasant and cooperative. Well-nourished and  well-developed.  Head:  Normocephalic and atraumatic. Eyes:  Without icterus, sclera clear and conjunctiva pink.  Ears:  Normal auditory acuity. Mouth:  No deformity or lesions, oral mucosa pink.  Throat/Neck:  Supple, without mass or thyromegaly. Cardiovascular:  S1, S2 present without murmurs appreciated. Normal pulses noted. Extremities without clubbing or edema. Respiratory:  Clear to auscultation bilaterally. No wheezes, rales, or rhonchi. No distress.  Gastrointestinal:  +BS, soft, non-tender and non-distended. No HSM noted. No guarding or rebound. No masses appreciated.  Rectal:  Deferred  Musculoskalatal:  Symmetrical without gross deformities. Normal posture. Skin:  Intact without significant lesions or rashes. Neurologic:  Alert and oriented x4;  grossly normal neurologically. Psych:  Alert and cooperative. Normal mood and affect. Heme/Lymph/Immune:  No significant cervical adenopathy. No excessive bruising noted.    02/22/2019 9:53 AM   Disclaimer: This note was dictated with voice recognition software. Similar sounding words can inadvertently be transcribed and may not be corrected upon review.

## 2019-02-22 NOTE — Patient Instructions (Signed)
Your health issues we discussed today were:   Belching: 1. This is a common symptom from reflux 2. It can also be caused by inadvertently swallowing air or eating/drinking gas producing foods such as sodas, cruciferous vegetables (broccoli, cabbage, etc.) 3. I recommend you take your acid blocker once a day, every day.  Take it for sing in the morning on empty stomach 4. You can restart your probiotic as this seemed to help you before 5. I do not think there is a need for a CT scan of your abdomen or an upper endoscopy just yet. 6. Call us if you have any worsening or severe symptoms  Overall I recommend:  1. Continue your other current medications 2. Return for follow-up in 2 months 3. Call us if you have any questions or concerns.   Because of recent events of COVID-19 ("Coronavirus"), follow CDC recommendations:  1. Wash your hand frequently 2. Avoid touching your face 3. Stay away from people who are sick 4. If you have symptoms such as fever, cough, shortness of breath then call your healthcare provider for further guidance 5. If you are sick, STAY AT HOME unless otherwise directed by your healthcare provider. 6. Follow directions from state and national officials regarding staying safe   At [facility] we value your feedback. You may receive a survey about your visit today. Please share your experience as we strive to create trusting relationships with our patients to provide genuine, compassionate, quality care.  We appreciate your understanding and patience as we review any laboratory studies, imaging, and other diagnostic tests that are ordered as we care for you. Our office policy is 5 business days for review of these results, and any emergent or urgent results are addressed in a timely manner for your best interest. If you do not hear from our office in 1 week, please contact us.   We also encourage the use of MyChart, which contains your medical information  for your review as well. If you are not enrolled in this feature, an access code is on this after visit summary for your convenience. Thank you for allowing [place] to be involved in your care.  It was great to see you today!  I hope you have a Happy Thanksgiving!!

## 2019-02-22 NOTE — Assessment & Plan Note (Signed)
The patient feels fine her bloating is improved with change in diet and increase exercise.  Recommend she continue her current regimen and follow-up in 2 months.

## 2019-02-22 NOTE — Progress Notes (Signed)
NO PCP PER PATIENT °

## 2019-02-22 NOTE — Assessment & Plan Note (Signed)
I feel the majority of her symptoms are likely somewhat silent GERD symptoms including belching, bloating.  She stopped taking her PPI daily because a coworker told her she felt her symptoms were more like anxiety.  She only takes her PPI when she knows she is going to be a trigger food that typically causes reflux.  I discussed pharmacogenetics at a basic level and why as needed PPI is not typically very effective.  Recommended she restart her medications as previously ordered.  Follow-up in 2 months.

## 2019-04-12 ENCOUNTER — Encounter: Payer: Self-pay | Admitting: Gastroenterology

## 2019-04-12 ENCOUNTER — Telehealth: Payer: Self-pay | Admitting: Gastroenterology

## 2019-04-12 ENCOUNTER — Other Ambulatory Visit: Payer: Self-pay

## 2019-04-12 ENCOUNTER — Ambulatory Visit (INDEPENDENT_AMBULATORY_CARE_PROVIDER_SITE_OTHER): Payer: Commercial Managed Care - PPO | Admitting: Gastroenterology

## 2019-04-12 VITALS — BP 145/96 | HR 88 | Temp 97.2°F | Ht 60.0 in | Wt 148.0 lb

## 2019-04-12 DIAGNOSIS — K219 Gastro-esophageal reflux disease without esophagitis: Secondary | ICD-10-CM

## 2019-04-12 NOTE — Telephone Encounter (Signed)
PT'S APPT WAS AT 9 AM AND LEFT ABRUPTLY AT 0903 STATING HER TIME IS VALUABLE. SHE APPEARED FRUSTRATED BECAUSE SHE HAD TO WAIT ON THE DOCTOR.

## 2019-04-13 NOTE — Progress Notes (Signed)
PT LEFT WITHOUT BEING SEEN.

## 2019-05-09 ENCOUNTER — Other Ambulatory Visit: Payer: Self-pay

## 2019-05-09 ENCOUNTER — Ambulatory Visit
Admission: EM | Admit: 2019-05-09 | Discharge: 2019-05-09 | Disposition: A | Discharge status: Home / Self Care | Payer: Commercial Managed Care - PPO | Attending: Family Medicine | Admitting: Family Medicine

## 2019-05-09 DIAGNOSIS — R002 Palpitations: Secondary | ICD-10-CM

## 2019-05-09 NOTE — Discharge Instructions (Addendum)
You have been describing a feeling that correlates with heart palpitations. Your EKG in the office was normal today. You really need a good physical and lab work up. Follow up with primary care or with Victorino Dike if needed.   Palpitations are irregular heart rhythms. They can be life threatening if not addressed properly. I recommend possibly getting in with cardiology, go to the Emergency Room if this occurs again or is occurring frequently. Cut back on caffeine and increase your water intake.   Go to the Emergency Room for shortness of breath, chest pain, palpitations, high fever, severe diarrhea, or other concerning symptoms.

## 2019-05-09 NOTE — ED Provider Notes (Signed)
[facility]    CSN: [id number] Arrival date & time: 05/09/19  1523      History   Chief Complaint Chief Complaint  Patient presents with  . Tachycardia    HPI Regina Castillo is a 49 y.o. female.   Reports episode of increased heart rate and "fluttering" for about 45 minutes today while at work. Reports that she had a bowel movement and then she began to feel better. She drove herself here. She denies chest pain, shortness of breath, fatigue, diaphoresis during episode. Reports that these episodes have been occurring "on and off" for a couple of months, but that she has never had one that lasted this long.   The history is provided by the patient.    Past Medical History:  Diagnosis Date  . Cyst of ovary, left   . Fatty liver   . Hypertension   . Irritable bowel syndrome    Never fully evaluated for this.    Patient Active Problem List   Diagnosis Date Noted  . Belching 02/22/2019  . BV (bacterial vaginosis) 08/25/2018  . Vaginal odor 08/25/2018  . Dysuria 08/25/2018  . Hematuria 08/25/2018  . Urgency of urination 08/25/2018  . GERD (gastroesophageal reflux disease) 09/13/2017  . Bloating 09/13/2017  . Fatty liver   . Irritable bowel syndrome   . Low vitamin D level 03/10/2016    Past Surgical History:  Procedure Laterality Date  . ABDOMINAL HYSTERECTOMY    . CESAREAN SECTION      OB History    Gravida  3   Para  3   Term  3   Preterm      AB      Living  3     SAB      TAB      Ectopic      Multiple      Live Births  3            Home Medications    Prior to Admission medications   Medication Sig Start Date End Date Taking? Authorizing Provider  amLODipine (NORVASC) 5 MG tablet Take 5 mg by mouth daily.  04/14/17   [provider]  cetirizine (ZYRTEC) 10 MG tablet Take 10 mg by mouth as needed.     [provider]  omeprazole (PRILOSEC) 40 MG capsule Take 1 capsule (40 mg total) by mouth  daily. Patient taking differently: Take 40 mg by mouth as needed.  05/31/18   Gelene Mink, NP    Family History Family History  Problem Relation Age of Onset  . Diabetes Father   . Diabetes Mother   . Hypertension Mother   . Colon cancer Neg Hx     Social History Social History   Tobacco Use  . Smoking status: Current Every Day Smoker    Packs/day: 0.25    Years: 15.00    Pack years: 3.75    Types: Cigarettes  . Smokeless tobacco: Never Used  Substance Use Topics  . Alcohol use: Yes    Comment: occasionally  . Drug use: No     Allergies   Patient has no known allergies.   Review of Systems Review of Systems  Constitutional: Negative for chills and fever.  HENT: Negative for ear pain and sore throat.   Eyes: Negative for pain and visual disturbance.  Respiratory: Negative for cough and shortness of breath.   Cardiovascular: Positive for palpitations. Negative for chest pain.  Gastrointestinal: Negative for  abdominal pain and vomiting.  Genitourinary: Negative for dysuria and hematuria.  Musculoskeletal: Negative for arthralgias and back pain.  Skin: Negative for color change and rash.  Neurological: Negative for seizures and syncope.  All other systems reviewed and are negative.    Physical Exam Triage Vital Signs ED Triage Vitals [05/09/19 1528]  Enc Vitals Group     BP (!) 145/94     Pulse Rate 94     Resp 18     Temp 98.3 F (36.8 C)     Temp src      SpO2 97 %     Weight      Height      Head Circumference      Peak Flow      Pain Score 0     Pain Loc      Pain Edu?      Excl. in [place]?    No data found.  Updated Vital Signs BP (!) 145/94   Pulse 94   Temp 98.3 F (36.8 C)   Resp 18   SpO2 97%      Physical Exam Vitals and nursing note reviewed.  Constitutional:      General: She is not in acute distress.    Appearance: She is well-developed.  HENT:     Head: Normocephalic and atraumatic.     Right Ear: Tympanic membrane  normal.     Left Ear: Tympanic membrane normal.  Eyes:     Conjunctiva/sclera: Conjunctivae normal.  Cardiovascular:     Rate and Rhythm: Normal rate and regular rhythm.     Pulses: Normal pulses.     Heart sounds: Normal heart sounds. No murmur.  Pulmonary:     Effort: Pulmonary effort is normal. No respiratory distress.     Breath sounds: Normal breath sounds.  Abdominal:     General: Abdomen is flat.     Palpations: Abdomen is soft.     Tenderness: There is no abdominal tenderness.  Musculoskeletal:        General: Normal range of motion.     Cervical back: Neck supple.  Skin:    General: Skin is warm and dry.  Neurological:     General: No focal deficit present.     Mental Status: She is alert and oriented to person, place, and time.  Psychiatric:        Mood and Affect: Mood normal.        Behavior: Behavior normal.      UC Treatments / Results  Labs (all labs ordered are listed, but only abnormal results are displayed) Labs Reviewed - No data to display  EKG   Radiology No results found.  Procedures Procedures (including critical care time)  Medications Ordered in UC Medications - No data to display  Initial Impression / Assessment and Plan / UC Course  I have reviewed the triage vital signs and the nursing notes.  Pertinent labs & imaging results that were available during my care of the patient were reviewed by me and considered in my medical decision making (see chart for details).     Presents with complaint of episode of palpitations today, relieved after having bowel movement. EKG in office normal today, no previous EKG to compare. Enforced the need to establish a primary care provider. Would consider cardiology as well. Instructed on dietary and activity changes that can help decrease HR. Instructed on when to go to the ED. Final Clinical Impressions(s) / UC Diagnoses  Final diagnoses:  Palpitations     Discharge Instructions     You have  been describing a feeling that correlates with heart palpitations. Your EKG in the office was normal today. You really need a good physical and lab work up. Follow up with primary care or with Victorino Dike if needed.   Palpitations are irregular heart rhythms. They can be life threatening if not addressed properly. I recommend possibly getting in with cardiology, go to the Emergency Room if this occurs again or is occurring frequently. Cut back on caffeine and increase your water intake.   Go to the Emergency Room for shortness of breath, chest pain, palpitations, high fever, severe diarrhea, or other concerning symptoms.    ED Prescriptions    None     I have reviewed the PDMP during this encounter.   Moshe Cipro, NP 05/09/19 1656

## 2019-05-09 NOTE — ED Triage Notes (Signed)
Pt presents with c/o rapid heart beat that developed suddenly today. Pt states it lasted for about 45 mins. Pt states that she did feel better after having BM

## 2019-05-10 ENCOUNTER — Ambulatory Visit: Payer: Commercial Managed Care - PPO | Admitting: Adult Health

## 2019-05-11 ENCOUNTER — Ambulatory Visit: Payer: Commercial Managed Care - PPO | Admitting: Gastroenterology

## 2019-05-16 ENCOUNTER — Ambulatory Visit: Payer: Commercial Managed Care - PPO | Admitting: Family Medicine

## 2019-05-24 ENCOUNTER — Other Ambulatory Visit: Payer: Self-pay

## 2019-05-24 ENCOUNTER — Ambulatory Visit (INDEPENDENT_AMBULATORY_CARE_PROVIDER_SITE_OTHER): Payer: Commercial Managed Care - PPO | Admitting: Gastroenterology

## 2019-05-24 ENCOUNTER — Encounter: Payer: Self-pay | Admitting: Gastroenterology

## 2019-05-24 DIAGNOSIS — R140 Abdominal distension (gaseous): Secondary | ICD-10-CM

## 2019-05-24 MED ORDER — AMOXICILLIN-POT CLAVULANATE 500-125 MG PO TABS: ORAL_TABLET | ORAL | 0 refills | Status: DC

## 2019-05-24 NOTE — Patient Instructions (Addendum)
DRINK WATER TO KEEP YOUR URINE LIGHT YELLOW.  TO REDUCE BLOATING:   1. AVOID ITEMS THAT CAUSE BLOATING & GAS. SEE INFO BELOW.    2. TAKE AUGMENTIN TWICE DAILY FOR 5 DAYS.  IT MAY CAUSE ABDOMINAL PAIN, NAUSEA, VOMITING, RASH, OR EXPLOSIVE DIARRHEA.   PLEASE CALL IN 3 WEEKS IF SYMPTOMS ARE NOT IMPROVED AND WE CAN SET YOU UP FOR AN UPPER ENDOSCOPY.   FOLLOW UP IN 3 MOS.   BLOATING AND GAS PREVENTION  Although gas may be uncomfortable and embarrassing, it is not life-threatening. Understanding causes, ways to reduce symptoms, and treatment will help most people find some relief. Points to remember . Everyone has gas in the digestive tract. Marland Kitchen People often believe normal passage of gas to be excessive. . Gas comes from two main sources: swallowed air and normal breakdown of certain foods by harmless bacteria naturally present in the large intestine. . Many foods with carbohydrates can cause gas. Fats and proteins cause little gas. . Foods that may cause gas include o beans  o vegetables, such as broccoli, cabbage, brussels sprouts, onions, artichokes, and asparagus  o fruits, such as pears, apples, and peaches  o whole grains, such as whole wheat and bran  o soft drinks and fruit drinks  o milk and milk products, such as cheese and ice cream, and packaged foods prepared with lactose, such as bread, cereal, and salad dressing  o foods containing sorbitol, such as dietetic foods and sugar free candies and gums . The most common symptoms of gas are belching, flatulence, bloating, and abdominal pain. However, some of these symptoms are often caused by an intestinal disorder, such as irritable bowel syndrome, rather than too much gas. . The most common ways to reduce the discomfort of gas are changing diet, taking nonprescription medicines, and reducing the amount of air swallowed. . Digestive enzymes, such as lactase supplements, actually help digest carbohydrates and may allow people to eat  foods that normally cause gas.

## 2019-05-24 NOTE — Assessment & Plan Note (Addendum)
SYMPTOMS NOT IDEALLY CONTROLLED. DIFFERENTIAL DIAGNOSIS INCLUDES: SMALL INTESTINE BACTERIAL OVERGROWTH, LESS LIKELY H PYLORI GASTRITIS, CELIAC SPRUE, OR PUD.  DRINK WATER TO KEEP YOUR URINE LIGHT YELLOW.  TO REDUCE BLOATING:   1. AVOID ITEMS THAT CAUSE BLOATING & GAS. SEE INFO BELOW/   2. TAKE AUGMENTIN TWICE DAILY FOR 5 DAYS.  IT MAY CAUSE ABDOMINAL PAIN, NAUSEA, VOMITING, RASH, OR EXPLOSIVE DIARRHEA.  PLEASE CALL IN 3 WEEKS IF SYMPTOMS ARE NOT IMPROVED AND WE CAN SET YOU UP FOR AN UPPER ENDOSCOPY. FOLLOW UP IN 3 MOS.

## 2019-05-24 NOTE — Progress Notes (Signed)
Subjective:    Patient ID: Regina Castillo, female    DOB: 04/04/1971, 49 y.o.   MRN: [id number] Patient, No Pcp Per  HPI FEELING BLOATED BUT NOT EATING A LOT. HAS CHANGED HER DIET. EATING LESS RED MEAT. STOPPED TAKING ACID REFLUX MEDS. STARTED THROWING UP AND THROAT ON FIRE. BMs: 2-3X/DAY. NO CT SCAN. WANTS AN EGD. TRYING TO GET TO THE BOTTOM IF THIS. NO ASPIRIN, BC/GOODY POWDERS, IBUPROFEN/MOTRIN, OR NAPROXEN/ALEVE. ETOH: RARE BEER(3-4X IN 7 DAYS). LOOKS AT STOOL. NOT SURE IF BRBPR. ?IBS BUT WHEN STOPPED EATING RED MEAT ABDOMINAL CRAMPING WENT AWAY. MILK: NO, "CAN'T" CHEESE: SOME, 2X/MO. ICE CREAM: RARE. WEIGHT LOSS: UP AND DOWN INTENTIONALLY. APPETITE: GREAT, HEARTBURN CONTROLLED WITH OMEPRAZOLE. NO TRAVEL. NO ABX TRIED. RAN OUT OF PROBIOTICS. IF SHE GOES A LONG TIME WITHOUT EATING SHE HAS A DEEP HUNGER PAIN. ATE TUNA AND IT WENT AWAY. ENJOYS HER JOB. USES HER PRUDENT JUDGEMENT. NEVER HAD COLONOSCOPY.   PT DENIES FEVER, CHILLS, HEMATOCHEZIA, HEMATEMESIS, nausea, vomiting, melena, diarrhea, CHEST PAIN, SHORTNESS OF BREATH,  CHANGE IN BOWEL IN HABITS, constipation, problems swallowing, problems with sedation, OR heartburn or indigestion.  Past Medical History:  Diagnosis Date  . Cyst of ovary, left   . Fatty liver   . Hypertension   . Irritable bowel syndrome    Never fully evaluated for this.   Past Surgical History:  Procedure Laterality Date  . ABDOMINAL HYSTERECTOMY    . CESAREAN SECTION     No Known Allergies  Current Outpatient Medications  Medication Sig    . amLODipine (NORVASC) 5 MG tablet Take 5 mg by mouth daily.     . cetirizine (ZYRTEC) 10 MG tablet Take 10 mg by mouth as needed.     Marland Kitchen omeprazole (PRILOSEC) 40 MG capsule Take 1 capsule (40 mg total) by mouth daily.     Family History  Problem Relation Age of Onset  . Diabetes Father   . Diabetes Mother   . Hypertension Mother   . Colon cancer Neg Hx    Review of Systems PER HPI OTHERWISE ALL SYSTEMS ARE  NEGATIVE.    Objective:   Physical Exam Constitutional:      General: She is not in acute distress.    Appearance: Normal appearance.  HENT:     Mouth/Throat:     Comments: MASK IN PLACE Eyes:     General: No scleral icterus.    Pupils: Pupils are equal, round, and reactive to light.  Cardiovascular:     Rate and Rhythm: Normal rate and regular rhythm.     Pulses: Normal pulses.     Heart sounds: Normal heart sounds.  Pulmonary:     Effort: Pulmonary effort is normal.     Breath sounds: Normal breath sounds.  Abdominal:     General: Bowel sounds are normal.     Palpations: Abdomen is soft.     Tenderness: There is no abdominal tenderness.  Musculoskeletal:     Cervical back: Normal range of motion.     Right lower leg: No edema.     Left lower leg: No edema.  Lymphadenopathy:     Cervical: No cervical adenopathy.  Skin:    General: Skin is warm and dry.  Neurological:     Mental Status: She is alert and oriented to person, place, and time.     Comments: NO  NEW FOCAL DEFICITS  Psychiatric:        Mood and Affect: Mood normal.  Comments: NORMAL AFFECT       Assessment & Plan:

## 2019-06-22 ENCOUNTER — Ambulatory Visit
Admission: EM | Admit: 2019-06-22 | Discharge: 2019-06-22 | Disposition: A | Discharge status: Home / Self Care | Payer: Commercial Managed Care - PPO

## 2019-06-22 ENCOUNTER — Other Ambulatory Visit: Payer: Self-pay

## 2019-06-22 DIAGNOSIS — R42 Dizziness and giddiness: Secondary | ICD-10-CM | POA: Diagnosis not present

## 2019-06-22 NOTE — ED Provider Notes (Signed)
[facility]    CSN: [id number] Arrival date & time: 06/22/19  1112      History   Chief Complaint Chief Complaint  Patient presents with  . Dizziness    HPI Regina Castillo is a 49 y.o. female.   Regina Castillo is a 49 y.o. female with hx of dizziness 3 years ago presents with a complaint of dizziness that began 2 weeks ago. Report symptoms started after she turned her head .  Denies, trauma, and report recent sinus infection and allergy symtom.  Describes the dizziness as "fainting." States that it is intermittent with episodes lasting 5 minutes.  Hasn't  tried any medication.  Nothing make her symptoms worse.  Has not had dizziness episode.  Admits to previous symptoms that improved without treatment.  Denies fever, chills, nausea, vomiting, hearing changes, tinnitus, ear pain, chest pain, syncope, SOB, weakness, slurred speech, memory or emotional changes, facial drooping/ asymmetry, incoordination, numbness or tingling, abdominal pain, changes in bowel or bladder habits.    The history is provided by the patient. No language interpreter was used.    Past Medical History:  Diagnosis Date  . Cyst of ovary, left   . Fatty liver   . Hypertension   . Irritable bowel syndrome    Never fully evaluated for this.    Patient Active Problem List   Diagnosis Date Noted  . Belching 02/22/2019  . BV (bacterial vaginosis) 08/25/2018  . Vaginal odor 08/25/2018  . Dysuria 08/25/2018  . Hematuria 08/25/2018  . Urgency of urination 08/25/2018  . GERD (gastroesophageal reflux disease) 09/13/2017  . Bloating 09/13/2017  . Fatty liver   . Irritable bowel syndrome   . Low vitamin D level 03/10/2016    Past Surgical History:  Procedure Laterality Date  . ABDOMINAL HYSTERECTOMY    . CESAREAN SECTION      OB History    Gravida  3   Para  3   Term  3   Preterm      AB      Living  3     SAB      TAB      Ectopic      Multiple      Live Births  3              Home Medications    Prior to Admission medications   Medication Sig Start Date End Date Taking? Authorizing Provider  amLODipine (NORVASC) 5 MG tablet Take 5 mg by mouth daily.  04/14/17  Yes [provider]  cetirizine (ZYRTEC) 10 MG tablet Take 10 mg by mouth as needed.    Yes [provider]  omeprazole (PRILOSEC) 40 MG capsule Take 1 capsule (40 mg total) by mouth daily. 05/31/18  Yes Gelene Mink, NP  amoxicillin-clavulanate (AUGMENTIN) 500-125 MG tablet 1 PO BID FOR 5 DAYS 05/24/19   West Bali, MD    Family History Family History  Problem Relation Age of Onset  . Diabetes Father   . Diabetes Mother   . Hypertension Mother   . Colon cancer Neg Hx     Social History Social History   Tobacco Use  . Smoking status: Current Every Day Smoker    Packs/day: 0.25    Years: 15.00    Pack years: 3.75    Types: Cigarettes  . Smokeless tobacco: Never Used  Substance Use Topics  . Alcohol use: Yes    Comment: occasionally  . Drug  use: No     Allergies   Patient has no known allergies.   Review of Systems Review of Systems  Constitutional: Negative.   HENT: Negative.   Respiratory: Negative.   Cardiovascular: Negative.   Neurological: Positive for dizziness.  All other systems reviewed and are negative.    Physical Exam Triage Vital Signs ED Triage Vitals  Enc Vitals Group     BP      Pulse      Resp      Temp      Temp src      SpO2      Weight      Height      Head Circumference      Peak Flow      Pain Score      Pain Loc      Pain Edu?      Excl. in GC?    Orthostatic VS for the past 24 hrs:  BP- Sitting Pulse- Sitting BP- Standing at 0 minutes Pulse- Standing at 0 minutes  06/22/19 1153 (!) 156/99 84 (!) 144/93 90    Updated Vital Signs BP (!) 150/98 (BP Location: Right Arm)   Pulse 95   Temp 98.9 F (37.2 C) (Oral)   Resp 18   Ht 5' (1.524 m)   Wt 146 lb (66.2 kg)   SpO2 97%   BMI 28.51 kg/m    Visual Acuity Right Eye Distance:   Left Eye Distance:   Bilateral Distance:    Right Eye Near:   Left Eye Near:    Bilateral Near:     Physical Exam Vitals and nursing note reviewed.  Constitutional:      General: She is not in acute distress.    Appearance: Normal appearance. She is normal weight. She is not ill-appearing or toxic-appearing.  HENT:     Head: Normocephalic.     Right Ear: Tympanic membrane, ear canal and external ear normal. There is no impacted cerumen.     Left Ear: Tympanic membrane, ear canal and external ear normal. There is no impacted cerumen.  Cardiovascular:     Rate and Rhythm: Normal rate and regular rhythm.     Pulses: Normal pulses.     Heart sounds: Normal heart sounds. No murmur. No gallop.   Pulmonary:     Effort: Pulmonary effort is normal. No respiratory distress.     Breath sounds: Normal breath sounds. No stridor. No wheezing, rhonchi or rales.  Chest:     Chest wall: No tenderness.  Abdominal:     General: Abdomen is flat. Bowel sounds are normal.     Palpations: Abdomen is soft.  Musculoskeletal:        General: No swelling or tenderness. Normal range of motion.  Neurological:     General: No focal deficit present.     Mental Status: She is alert and oriented to person, place, and time.     Cranial Nerves: Cranial nerves are intact.     Sensory: Sensation is intact.     Motor: Motor function is intact.     Coordination: Coordination is intact.     Gait: Gait is intact.     Deep Tendon Reflexes:     Reflex Scores:      Patellar reflexes are 2+ on the right side and 2+ on the left side. Psychiatric:        Behavior: Behavior normal.      UC Treatments / Results  Labs (all labs ordered are listed, but only abnormal results are displayed) Labs Reviewed - No data to display  EKG   Radiology No results found.  Procedures Procedures (including critical care time)  Medications Ordered in UC Medications - No data to  display  Initial Impression / Assessment and Plan / UC Course  I have reviewed the triage vital signs and the nursing notes.  Pertinent labs & imaging results that were available during my care of the patient were reviewed by me and considered in my medical decision making (see chart for details).    Patient is stable for discharge.  EKG showed a normal sinus rhythm.  Orthostatic blood pressure was negative.  Symptoms is more likely from vasovagal.  She was advised to get a blood pressure machine to monitor BP at home.  Follow-up with primary care to have blood work completed.  She is in agreement with the plan of care.  Final Clinical Impressions(s) / UC Diagnoses   Final diagnoses:  Dizziness     Discharge Instructions     Your EKG showed normal sinus rhythm Orthostatic blood pressure was negative Advised for home blood pressure monitoring.  You may get a blood pressure device at [facility]. Follow-up with primary care for further evaluation and blood work Return or go to ED for worsening of symptoms    ED Prescriptions    None     PDMP not reviewed this encounter.   Emerson Monte, [place] 06/22/19 1214

## 2019-06-22 NOTE — ED Triage Notes (Signed)
Pt reports dizzy spells over 3 years ago but got better.  Reports dizzy spells started coming back approx 2 weeks ago.   Pt taking a bp medication that says dizziness is a side effect.  Pt says was sitting at the traffic light yesterday after work, rolled her neck around, and had sudden onset of dizziness and feeling faint.

## 2019-06-22 NOTE — Discharge Instructions (Addendum)
Your EKG showed normal sinus rhythm Orthostatic blood pressure was negative Advised for home blood pressure monitoring.  You may get a blood pressure device at [facility]. Follow-up with primary care for further evaluation and blood work Return or go to ED for worsening of symptoms

## 2019-06-28 ENCOUNTER — Encounter: Payer: Self-pay | Admitting: Adult Health

## 2019-07-03 ENCOUNTER — Other Ambulatory Visit: Payer: Self-pay | Admitting: Gastroenterology

## 2019-07-08 ENCOUNTER — Other Ambulatory Visit: Payer: Self-pay | Admitting: Gastroenterology

## 2019-07-08 DIAGNOSIS — R140 Abdominal distension (gaseous): Secondary | ICD-10-CM

## 2019-07-08 DIAGNOSIS — K219 Gastro-esophageal reflux disease without esophagitis: Secondary | ICD-10-CM

## 2019-07-10 ENCOUNTER — Telehealth: Payer: Self-pay | Admitting: Gastroenterology

## 2019-07-10 DIAGNOSIS — K219 Gastro-esophageal reflux disease without esophagitis: Secondary | ICD-10-CM

## 2019-07-10 DIAGNOSIS — R140 Abdominal distension (gaseous): Secondary | ICD-10-CM

## 2019-07-10 NOTE — Telephone Encounter (Signed)
Pt was checking to see if we received the PA on 2 medications from her pharmacy. Please advise. [phone]

## 2019-07-10 NOTE — Telephone Encounter (Signed)
Spoke with pt and pharmacy. Pt needs a refill of Omeprazole 40 mg daily and pt would like a refill of Augmentin 500-125 mg 1 tab bid x 5 days for the bloated. Previously prescribed by [facility].

## 2019-07-11 MED ORDER — AMOXICILLIN-POT CLAVULANATE 500-125 MG PO TABS: ORAL_TABLET | ORAL | 0 refills | Status: DC

## 2019-07-11 MED ORDER — OMEPRAZOLE 40 MG PO CPDR: 40.0000 mg | DELAYED_RELEASE_CAPSULE | Freq: Every day | ORAL | 3 refills | Status: DC

## 2019-07-11 NOTE — Addendum Note (Signed)
Addended by: Gelene Mink on: 07/11/2019 08:57 AM   Modules accepted: Orders

## 2019-07-11 NOTE — Addendum Note (Signed)
Addended by: West Bali on: 07/11/2019 11:40 AM   Modules accepted: Orders

## 2019-07-11 NOTE — Telephone Encounter (Signed)
Prilosec sent to pharmacy. Did she notice improvement with  Augmentin? Per Dr. Darrick Penna' note, would need EGD if persistent symptoms.

## 2019-07-11 NOTE — Telephone Encounter (Signed)
Noted. Spoke with pt. Pt is aware that RX was sent to her pharmacy and symptoms that can come along with it.

## 2019-07-11 NOTE — Telephone Encounter (Signed)
Dr. Darrick Penna, see below.

## 2019-07-11 NOTE — Telephone Encounter (Signed)
Spoke with pt. Pt noticed some improvement with the bloating when Augmentin was taken. Pt wants to do another round of Augmentin before pursuing an EGD.

## 2019-07-11 NOTE — Telephone Encounter (Signed)
PLEASE CALL PT.Rx sent. TAKE AUGMENTIN TWICE DAILY FOR 5 DAYS.  IT MAY CAUSE ABDOMINAL PAIN, NAUSEA, VOMITING, RASH, OR EXPLOSIVE DIARRHEA.

## 2019-08-08 ENCOUNTER — Telehealth: Payer: Self-pay | Admitting: Adult Health

## 2019-08-08 NOTE — Telephone Encounter (Signed)

## 2019-08-09 ENCOUNTER — Encounter: Payer: Self-pay | Admitting: Adult Health

## 2019-08-09 ENCOUNTER — Other Ambulatory Visit: Payer: Self-pay

## 2019-08-09 ENCOUNTER — Ambulatory Visit (INDEPENDENT_AMBULATORY_CARE_PROVIDER_SITE_OTHER): Payer: Commercial Managed Care - PPO | Admitting: Adult Health

## 2019-08-09 VITALS — BP 132/86 | HR 82 | Ht 60.0 in | Wt 148.0 lb

## 2019-08-09 DIAGNOSIS — Z01419 Encounter for gynecological examination (general) (routine) without abnormal findings: Secondary | ICD-10-CM

## 2019-08-09 DIAGNOSIS — Z1212 Encounter for screening for malignant neoplasm of rectum: Secondary | ICD-10-CM | POA: Diagnosis not present

## 2019-08-09 DIAGNOSIS — R195 Other fecal abnormalities: Secondary | ICD-10-CM | POA: Diagnosis not present

## 2019-08-09 DIAGNOSIS — Z1211 Encounter for screening for malignant neoplasm of colon: Secondary | ICD-10-CM | POA: Diagnosis not present

## 2019-08-09 DIAGNOSIS — Z7689 Persons encountering health services in other specified circumstances: Secondary | ICD-10-CM | POA: Insufficient documentation

## 2019-08-09 LAB — HEMOCCULT GUIAC POC 1CARD (OFFICE): Fecal Occult Blood, POC: POSITIVE — AB

## 2019-08-09 NOTE — Progress Notes (Signed)
Patient ID: SILVA AAMODT, female   DOB: 06-16-1970, 49 y.o.   MRN: [id number] History of Present Illness: Regina Castillo is a 49 year old black female, single, sp hysterectomy on for a well woman gyn exam. She works at [employer]. No current PCP.   Current Medications, Allergies, Past Medical History, Past Surgical History, Family History and Social History were reviewed in [employer] record.     Review of Systems: Patient denies any headaches, hearing loss, fatigue, blurred vision, shortness of breath, chest pain, abdominal pain, problems with bowel movements, urination, or intercourse. No joint pain or mood swings. Has had bloating and saw [facility] and is on Prilosec and it has helped    Physical Exam:BP 132/86 (BP Location: Right Arm, Patient Position: Sitting, Cuff Size: Normal)   Pulse 82   Ht 5' (1.524 m)   Wt 148 lb (67.1 kg)   BMI 28.90 kg/m  General:  Well developed, well nourished, no acute distress Skin:  Warm and dry Neck:  Midline trachea, normal thyroid, good ROM, no lymphadenopathy Lungs; Clear to auscultation bilaterally Breast:  No dominant palpable mass, retraction, or nipple discharge Cardiovascular: Regular rate and rhythm Abdomen:  Soft, non tender, no hepatosplenomegaly Pelvic:  External genitalia is normal in appearance, no lesions.  The vagina is normal in appearance. Urethra has no lesions or masses. The cervix and uterus are absent. No adnexal masses or tenderness noted.Bladder is non tender, no masses felt. Rectal: Good sphincter tone, no polyps, or hemorrhoids felt.  Hemoccult positive Extremities/musculoskeletal:  No swelling or varicosities noted, no clubbing or cyanosis Psych:  No mood changes, alert and cooperative,seems happy AA 4 Fall risk is low PHQ 9 score is 0. Examination chaperoned by Stoney Bang LPN  Impression and Plan: 1. Encounter for well woman exam with routine gynecological exam Physical in 1 year Mammogram  yearly Labs with the county, to bring me a copy for her records   2. Screening for colorectal cancer   3. Positive fecal occult blood test Sent 3 cards home to do and bring back, if any + refer back to [facility] for colonoscopy

## 2019-08-29 ENCOUNTER — Ambulatory Visit: Payer: Commercial Managed Care - PPO | Admitting: Gastroenterology

## 2019-09-11 ENCOUNTER — Other Ambulatory Visit (INDEPENDENT_AMBULATORY_CARE_PROVIDER_SITE_OTHER): Payer: Commercial Managed Care - PPO

## 2019-09-11 ENCOUNTER — Telehealth: Payer: Self-pay | Admitting: Adult Health

## 2019-09-11 DIAGNOSIS — Z1211 Encounter for screening for malignant neoplasm of colon: Secondary | ICD-10-CM | POA: Diagnosis not present

## 2019-09-11 LAB — HEMOCCULT GUIAC POC 1CARD (OFFICE)
Card #2 Fecal Occult Blod, POC: NEGATIVE
Card #3 Fecal Occult Blood, POC: NEGATIVE
Fecal Occult Blood, POC: NEGATIVE

## 2019-09-11 NOTE — Telephone Encounter (Signed)
Pt aware that all 3 hemoccult cards  negative and labs good except vitamin D is low,she is taking vitamin D gummies. She is thinking of a different GI since Dr Darrick Penna left, she will let me know.She went to Urgent Care for dizzy episode, has happened several times in last month.

## 2019-09-29 ENCOUNTER — Telehealth: Payer: Self-pay | Admitting: Adult Health

## 2019-09-29 NOTE — Telephone Encounter (Signed)
Patient wanted to leave a message for Victorino Dike, but she wanted to know can she give her a referral for a primary care, and can someone give her a call back.

## 2019-10-02 ENCOUNTER — Telehealth: Payer: Self-pay | Admitting: Adult Health

## 2019-10-02 ENCOUNTER — Encounter: Payer: Self-pay | Admitting: Adult Health

## 2019-10-02 MED ORDER — CEPHALEXIN 500 MG PO CAPS: 500.0000 mg | ORAL_CAPSULE | Freq: Three times a day (TID) | ORAL | 0 refills | Status: DC

## 2019-10-02 NOTE — Telephone Encounter (Signed)
Pt requesting that Regina Castillo call in something for her sinuses, she feels like she has fluid in her ears and nasal congestion. She has used nasal spray. Advised I would send message to [facility].

## 2019-10-02 NOTE — Addendum Note (Signed)
Addended by: Cyril Mourning A on: 10/02/2019 03:55 PM   Modules accepted: Orders

## 2019-10-02 NOTE — Telephone Encounter (Signed)
Pt informed to call Dr. Jeanice Lim to set up appointment for primary care. Patient appreciative.

## 2019-10-02 NOTE — Telephone Encounter (Signed)
Patient requesting call back regarding previous phone call from this morning.

## 2019-10-02 NOTE — Telephone Encounter (Signed)
Pt complains of sinus congestion and ears feel muffled, smell and taste, off, has had negative COVID test she says,has tried allegra and flonase, will rx keflex 500 mg tid for 7 days, if not better may want to see ENT

## 2019-11-23 ENCOUNTER — Other Ambulatory Visit: Payer: Self-pay | Admitting: Obstetrics & Gynecology

## 2019-11-23 DIAGNOSIS — Z1231 Encounter for screening mammogram for malignant neoplasm of breast: Secondary | ICD-10-CM

## 2019-12-19 ENCOUNTER — Ambulatory Visit
Admission: RE | Admit: 2019-12-19 | Discharge: 2019-12-19 | Disposition: A | Discharge status: Home / Self Care | Payer: Commercial Managed Care - PPO | Source: Ambulatory Visit | Attending: Obstetrics & Gynecology | Admitting: Obstetrics & Gynecology

## 2019-12-19 ENCOUNTER — Other Ambulatory Visit: Payer: Self-pay

## 2019-12-19 DIAGNOSIS — Z1231 Encounter for screening mammogram for malignant neoplasm of breast: Secondary | ICD-10-CM

## 2020-03-15 ENCOUNTER — Ambulatory Visit (INDEPENDENT_AMBULATORY_CARE_PROVIDER_SITE_OTHER): Payer: Commercial Managed Care - PPO | Admitting: Internal Medicine

## 2020-03-15 ENCOUNTER — Ambulatory Visit (HOSPITAL_COMMUNITY)
Admission: RE | Admit: 2020-03-15 | Discharge: 2020-03-15 | Disposition: A | Discharge status: Home / Self Care | Payer: Commercial Managed Care - PPO | Source: Ambulatory Visit | Attending: Internal Medicine | Admitting: Internal Medicine

## 2020-03-15 ENCOUNTER — Encounter: Payer: Self-pay | Admitting: Internal Medicine

## 2020-03-15 ENCOUNTER — Other Ambulatory Visit: Payer: Self-pay

## 2020-03-15 VITALS — BP 138/90 | HR 89 | Temp 98.9°F | Resp 18 | Ht 60.0 in | Wt 142.4 lb

## 2020-03-15 DIAGNOSIS — I10 Essential (primary) hypertension: Secondary | ICD-10-CM | POA: Diagnosis not present

## 2020-03-15 DIAGNOSIS — K219 Gastro-esophageal reflux disease without esophagitis: Secondary | ICD-10-CM

## 2020-03-15 DIAGNOSIS — M542 Cervicalgia: Secondary | ICD-10-CM | POA: Insufficient documentation

## 2020-03-15 DIAGNOSIS — R140 Abdominal distension (gaseous): Secondary | ICD-10-CM

## 2020-03-15 DIAGNOSIS — M5030 Other cervical disc degeneration, unspecified cervical region: Secondary | ICD-10-CM | POA: Insufficient documentation

## 2020-03-15 DIAGNOSIS — R42 Dizziness and giddiness: Secondary | ICD-10-CM

## 2020-03-15 DIAGNOSIS — Z7689 Persons encountering health services in other specified circumstances: Secondary | ICD-10-CM | POA: Diagnosis not present

## 2020-03-15 DIAGNOSIS — Z2821 Immunization not carried out because of patient refusal: Secondary | ICD-10-CM

## 2020-03-15 NOTE — Assessment & Plan Note (Signed)
Used to follow up with GI Not disturbing currently

## 2020-03-15 NOTE — Assessment & Plan Note (Signed)
Care established Previous chart reviewed History and medications reviewed with the patient 

## 2020-03-15 NOTE — Assessment & Plan Note (Signed)
BP Readings from Last 1 Encounters:  03/15/20 138/90   Well-controlled with Amlodipine Counseled for compliance with the medications Advised DASH diet and moderate exercise/walking, at least 150 mins/week

## 2020-03-15 NOTE — Assessment & Plan Note (Signed)
On Omeprazole 

## 2020-03-15 NOTE — Progress Notes (Signed)
New Patient Office Visit  Subjective:  Patient ID: Regina Castillo, female    DOB: 01-04-1971  Age: 49 y.o. MRN: [id number]  CC:  Chief Complaint  Patient presents with  . New Patient (Initial Visit)    New patient has been having neck stiffness and sharp pains on the left side for about 2 weeks. Usually this goes away but this time it is not. She has tried advil but this is not working. The nurse at work gave her cyclobenzapril 10 mg.    HPI Regina Castillo is a 49 year old female with PMH of hypertension, GERD, and chronic neck pain who presents for establishing care.  She complains of acute on chronic neck pain for last 2 weeks, for which a nurse at her workplace prescribed Flexeril.  She mentions having episodic neck pain, radiating to her left shoulder for many months.  She had been evaluated by her chiropractor in the past and had imaging and massage/manipulation in the past.  She denies any numbness, weakness or tingling in the left UE.  She tried taking Flexeril few times, but does not like it because she feels that she becomes slow with it during the daytime.  She requests a referral to neurosurgeon.  Patient is advised to get x-ray of the neck first, and if needed, MRI of the neck before visiting neurosurgeon.  She had 2 urgent care visits for dizziness in the past, which is not disturbing her now.  She denies trying meclizine.  She denies any recent upper respiratory tract infection.  BP is well-controlled. Takes medications regularly. Patient denies headache, dizziness, chest pain, dyspnea or palpitations.  Patient has a history of GERD, for which she takes omeprazole.  She has a history of chronic bloating sensation, for which she used to visit Dr. Darrick Penna, who has moved from this area. She denies to visit any other GI.  Patient has had 2 doses of Covid vaccine.  She denies to take flu vaccine despite explaining benefits.  Past Medical History:  Diagnosis Date  . Cyst of  ovary, left   . Fatty liver   . Hypertension   . Irritable bowel syndrome    Never fully evaluated for this.    Past Surgical History:  Procedure Laterality Date  . ABDOMINAL HYSTERECTOMY    . CESAREAN SECTION      Family History  Problem Relation Age of Onset  . Diabetes Father   . Diabetes Mother   . Hypertension Mother   . Colon cancer Neg Hx     Social History   Socioeconomic History  . Marital status: Single    Spouse name: Not on file  . Number of children: 3  . Years of education: Not on file  . Highest education level: Not on file  Occupational History  . Occupation: [employer]  Tobacco Use  . Smoking status: Current Every Day Smoker    Packs/day: 0.25    Years: 15.00    Pack years: 3.75    Types: Cigarettes  . Smokeless tobacco: Never Used  Vaping Use  . Vaping Use: Never used  Substance and Sexual Activity  . Alcohol use: Yes    Comment: occasionally  . Drug use: No  . Sexual activity: Yes    Birth control/protection: Surgical    Comment: hyst  Other Topics Concern  . Not on file  Social History Narrative   Works for [employer]. Single, not dating. Live with mother. Has grown children, 3 boys.  Eats  meat fruits and vegetables.  Wears seatbelt.  Currently lives with mother.   Social Determinants of Health   Financial Resource Strain: Low Risk   . Difficulty of Paying Living Expenses: Not very hard  Food Insecurity: No Food Insecurity  . Worried About Programme researcher, broadcasting/film/video in the Last Year: Never true  . Ran Out of Food in the Last Year: Never true  Transportation Needs: No Transportation Needs  . Lack of Transportation (Medical): No  . Lack of Transportation (Non-Medical): No  Physical Activity: Insufficiently Active  . Days of Exercise per Week: 2 days  . Minutes of Exercise per Session: 50 min  Stress: No Stress Concern Present  . Feeling of Stress : Not at all  Social Connections: Moderately Integrated  . Frequency of Communication with Friends and  Family: More than three times a week  . Frequency of Social Gatherings with Friends and Family: More than three times a week  . Attends Religious Services: More than 4 times per year  . Active Member of Clubs or Organizations: Yes  . Attends Banker Meetings: 1 to 4 times per year  . Marital Status: Never married  Intimate Partner Violence: Not At Risk  . Fear of Current or Ex-Partner: No  . Emotionally Abused: No  . Physically Abused: No  . Sexually Abused: No    ROS Review of Systems  Constitutional: Negative for chills and fever.  HENT: Negative for congestion, sinus pressure, sinus pain and sore throat.   Eyes: Negative for pain and discharge.  Respiratory: Negative for cough and shortness of breath.   Cardiovascular: Negative for chest pain and palpitations.  Gastrointestinal: Negative for abdominal pain, constipation, diarrhea, nausea and vomiting.  Endocrine: Negative for polydipsia and polyuria.  Genitourinary: Negative for dysuria and hematuria.  Musculoskeletal: Positive for back pain and neck pain. Negative for neck stiffness.  Skin: Negative for rash.  Neurological: Positive for dizziness. Negative for syncope, weakness, numbness and headaches.  Psychiatric/Behavioral: Negative for agitation and behavioral problems.    Objective:   Today's Vitals: BP 138/90 (BP Location: Right Arm, Patient Position: Sitting, Cuff Size: Normal)   Pulse 89   Temp 98.9 F (37.2 C) (Oral)   Resp 18   Ht 5' (1.524 m)   Wt 142 lb 6.4 oz (64.6 kg)   SpO2 100%   BMI 27.81 kg/m   Physical Exam Vitals reviewed.  Constitutional:      General: She is not in acute distress.    Appearance: She is not diaphoretic.  HENT:     Head: Normocephalic and atraumatic.     Nose: Nose normal.     Mouth/Throat:     Mouth: Mucous membranes are moist.  Eyes:     General: No scleral icterus.    Extraocular Movements: Extraocular movements intact.     Pupils: Pupils are equal, round,  and reactive to light.  Cardiovascular:     Rate and Rhythm: Normal rate and regular rhythm.     Pulses: Normal pulses.     Heart sounds: Normal heart sounds. No murmur heard.   Pulmonary:     Breath sounds: Normal breath sounds. No wheezing or rales.  Abdominal:     Palpations: Abdomen is soft.     Tenderness: There is no abdominal tenderness.  Musculoskeletal:        General: Tenderness (Cervical spinal area, no erythema or warmth) present.     Cervical back: Neck supple. No tenderness.  Right lower leg: No edema.     Left lower leg: No edema.  Skin:    General: Skin is warm.     Findings: No rash.  Neurological:     General: No focal deficit present.     Mental Status: She is alert and oriented to person, place, and time.  Psychiatric:        Mood and Affect: Mood normal.        Behavior: Behavior normal.     Assessment & Plan:   Problem List Items Addressed This Visit      Encounter to establish care - Primary   Care established Previous chart reviewed History and medications reviewed with the patient       Cardiovascular and Mediastinum   Primary hypertension    BP Readings from Last 1 Encounters:  03/15/20 138/90   Well-controlled with Amlodipine Counseled for compliance with the medications Advised DASH diet and moderate exercise/walking, at least 150 mins/week         Digestive   GERD (gastroesophageal reflux disease)    On Omeprazole        Other   Bloating    Used to follow up with GI Not disturbing currently            Neck pain    Recurrent neck pain, referred to left arm Check X-ray of the neck, if negative, plan to obtain MRI of the neck to r/o cervical spinal stenosis vs disc herniation      Relevant Orders   DG Neck Soft Tissue   Dizziness    Intermittent in the past Avoid sudden positional changes Advised trial of Meclizine, patient declines. Previous EKGs in the chart showing NSR. No chest pain, dyspnea or  palpitations.       Other Visit Diagnoses    Influenza vaccination declined          Outpatient Encounter Medications as of 03/15/2020  Medication Sig  . amLODipine (NORVASC) 5 MG tablet Take 5 mg by mouth daily.   . cyclobenzaprine (FLEXERIL) 10 MG tablet Take 10 mg by mouth at bedtime.  . fexofenadine-pseudoephedrine (ALLEGRA-D) 60-120 MG 12 hr tablet Take 1 tablet by mouth 2 (two) times daily.  . fluticasone (FLONASE) 50 MCG/ACT nasal spray Place into both nostrils.  Marland Kitchen omeprazole (PRILOSEC) 40 MG capsule Take 1 capsule (40 mg total) by mouth daily.  . [DISCONTINUED] amoxicillin-clavulanate (AUGMENTIN) 500-125 MG tablet Take 1 tablet by mouth twice daily for 5 days (Patient not taking: Reported on 03/15/2020)  . [DISCONTINUED] cephALEXin (KEFLEX) 500 MG capsule Take 1 capsule (500 mg total) by mouth 3 (three) times daily. (Patient not taking: Reported on 03/15/2020)  . [DISCONTINUED] cetirizine (ZYRTEC) 10 MG tablet Take 10 mg by mouth as needed.  (Patient not taking: Reported on 03/15/2020)   No facility-administered encounter medications on file as of 03/15/2020.    Follow-up: Return in about 3 months (around 06/13/2020).   Anabel Halon, MD

## 2020-03-15 NOTE — Patient Instructions (Signed)
Please take Tylenol as needed up to 3 times as needed for neck pain.  Okay to take Flexeril once for muscle spasms.  Continue to take Omeprazole for acid reflux.  Please perform simple neck exercises.  Please get X-ray of the neck done. We will talk about the further steps based on that.

## 2020-03-15 NOTE — Assessment & Plan Note (Signed)
Intermittent in the past Avoid sudden positional changes Advised trial of Meclizine, patient declines. Previous EKGs in the chart showing NSR. No chest pain, dyspnea or palpitations.

## 2020-03-15 NOTE — Assessment & Plan Note (Signed)
Recurrent neck pain, referred to left arm Check X-ray of the neck, if negative, plan to obtain MRI of the neck to r/o cervical spinal stenosis vs disc herniation

## 2020-03-15 NOTE — Progress Notes (Signed)
ne

## 2020-03-18 ENCOUNTER — Other Ambulatory Visit: Payer: Self-pay | Admitting: Internal Medicine

## 2020-03-18 DIAGNOSIS — M5030 Other cervical disc degeneration, unspecified cervical region: Secondary | ICD-10-CM

## 2020-03-19 NOTE — Progress Notes (Signed)
Pt advised of lab results with verbal understanding  

## 2020-03-22 ENCOUNTER — Telehealth: Payer: Self-pay | Admitting: *Deleted

## 2020-03-22 NOTE — Telephone Encounter (Signed)
Pt called this morning she said she told you when she was referred to [facility] that she wanted to see Dr Venetia Maxon. They have set her up with a different dr and she wants to know what was the reasoning behind this. I advised that I would send to our referral coordinator Katie and have this moved and she stated she wanted to know Dr Jill Alexanders reasoning because he may have had a reason for referring to this dr. Please advise

## 2020-03-26 MED ORDER — TRAMADOL HCL 50 MG PO TABS: 50.0000 mg | ORAL_TABLET | Freq: Two times a day (BID) | ORAL | 0 refills | Status: AC | PRN

## 2020-03-26 NOTE — Addendum Note (Signed)
Addended byTrena Platt on: 03/26/2020 09:37 AM   Modules accepted: Orders

## 2020-06-14 ENCOUNTER — Ambulatory Visit: Payer: Commercial Managed Care - PPO | Admitting: Internal Medicine

## 2020-07-05 DIAGNOSIS — Z6827 Body mass index (BMI) 27.0-27.9, adult: Secondary | ICD-10-CM | POA: Insufficient documentation

## 2020-07-12 ENCOUNTER — Ambulatory Visit (INDEPENDENT_AMBULATORY_CARE_PROVIDER_SITE_OTHER): Payer: Commercial Managed Care - PPO | Admitting: Internal Medicine

## 2020-07-12 ENCOUNTER — Other Ambulatory Visit: Payer: Self-pay

## 2020-07-12 ENCOUNTER — Encounter: Payer: Self-pay | Admitting: Internal Medicine

## 2020-07-12 VITALS — BP 160/100 | HR 84 | Resp 15 | Ht 60.0 in | Wt 139.0 lb

## 2020-07-12 DIAGNOSIS — I10 Essential (primary) hypertension: Secondary | ICD-10-CM

## 2020-07-12 DIAGNOSIS — F419 Anxiety disorder, unspecified: Secondary | ICD-10-CM | POA: Diagnosis not present

## 2020-07-12 DIAGNOSIS — M5030 Other cervical disc degeneration, unspecified cervical region: Secondary | ICD-10-CM | POA: Diagnosis not present

## 2020-07-12 MED ORDER — AMLODIPINE BESYLATE 5 MG PO TABS: 5.0000 mg | ORAL_TABLET | Freq: Every day | ORAL | 1 refills | Status: DC

## 2020-07-12 MED ORDER — HYDROCHLOROTHIAZIDE 12.5 MG PO CAPS: 12.5000 mg | ORAL_CAPSULE | Freq: Every day | ORAL | 0 refills | Status: DC

## 2020-07-12 NOTE — Assessment & Plan Note (Signed)
Prefers to try relaxation techniques Prefers to avoid any medication

## 2020-07-12 NOTE — Patient Instructions (Addendum)
Please continue taking Amlodipine 5 mg and start taking HCTZ 12.5 mg once daily.   [email or web address]">  DASH Eating Plan DASH stands for Dietary Approaches to Stop Hypertension. The DASH eating plan is a healthy eating plan that has been shown to:  Reduce high blood pressure (hypertension).  Reduce your risk for type 2 diabetes, heart disease, and stroke.  Help with weight loss. What are tips for following this plan? Reading food labels  Check food labels for the amount of salt (sodium) per serving. Choose foods with less than 5 percent of the Daily Value of sodium. Generally, foods with less than 300 milligrams (mg) of sodium per serving fit into this eating plan.  To find whole grains, look for the word "whole" as the first word in the ingredient list. Shopping  Buy products labeled as "low-sodium" or "no salt added."  Buy fresh foods. Avoid canned foods and pre-made or frozen meals. Cooking  Avoid adding salt when cooking. Use salt-free seasonings or herbs instead of table salt or sea salt. Check with your health care provider or pharmacist before using salt substitutes.  Do not fry foods. Cook foods using healthy methods such as baking, boiling, grilling, roasting, and broiling instead.  Cook with heart-healthy oils, such as olive, canola, avocado, soybean, or sunflower oil. Meal planning  Eat a balanced diet that includes: ? 4 or more servings of fruits and 4 or more servings of vegetables each day. Try to fill one-half of your plate with fruits and vegetables. ? 6-8 servings of whole grains each day. ? Less than 6 oz (170 g) of lean meat, poultry, or fish each day. A 3-oz (85-g) serving of meat is about the same size as a deck of cards. One egg equals 1 oz (28 g). ? 2-3 servings of low-fat dairy each day. One serving is 1 cup (237 mL). ? 1 serving of nuts, seeds, or beans 5 times each week. ? 2-3 servings of heart-healthy  fats. Healthy fats called omega-3 fatty acids are found in foods such as walnuts, flaxseeds, fortified milks, and eggs. These fats are also found in cold-water fish, such as sardines, salmon, and mackerel.  Limit how much you eat of: ? Canned or prepackaged foods. ? Food that is high in trans fat, such as some fried foods. ? Food that is high in saturated fat, such as fatty meat. ? Desserts and other sweets, sugary drinks, and other foods with added sugar. ? Full-fat dairy products.  Do not salt foods before eating.  Do not eat more than 4 egg yolks a week.  Try to eat at least 2 vegetarian meals a week.  Eat more home-cooked food and less restaurant, buffet, and fast food.   Lifestyle  When eating at a restaurant, ask that your food be prepared with less salt or no salt, if possible.  If you drink alcohol: ? Limit how much you use to:  0-1 drink a day for women who are not pregnant.  0-2 drinks a day for men. ? Be aware of how much alcohol is in your drink. In the [place] one drink equals one 12 oz bottle of beer (355 mL), one 5 oz glass of wine (148 mL), or one 1 oz glass of hard liquor (44 mL). General information  Avoid eating more than 2,300 mg of salt a day. If you have hypertension, you may need to reduce your sodium intake to 1,500 mg a day.  Work with your health care provider to  maintain a healthy body weight or to lose weight. Ask what an ideal weight is for you.  Get at least 30 minutes of exercise that causes your heart to beat faster (aerobic exercise) most days of the week. Activities may include walking, swimming, or biking.  Work with your health care provider or dietitian to adjust your eating plan to your individual calorie needs. What foods should I eat? Fruits All fresh, dried, or frozen fruit. Canned fruit in natural juice (without added sugar). Vegetables Fresh or frozen vegetables (raw, steamed, roasted, or grilled). Low-sodium or reduced-sodium tomato  and vegetable juice. Low-sodium or reduced-sodium tomato sauce and tomato paste. Low-sodium or reduced-sodium canned vegetables. Grains Whole-grain or whole-wheat bread. Whole-grain or whole-wheat pasta. Brown rice. Orpah Cobb. Bulgur. Whole-grain and low-sodium cereals. Pita bread. Low-fat, low-sodium crackers. Whole-wheat flour tortillas. Meats and other proteins Skinless chicken or [place]. Ground chicken or [place]. Pork with fat trimmed off. Fish and seafood. Egg whites. Dried beans, peas, or lentils. Unsalted nuts, nut butters, and seeds. Unsalted canned beans. Lean cuts of beef with fat trimmed off. Low-sodium, lean precooked or cured meat, such as sausages or meat loaves. Dairy Low-fat (1%) or fat-free (skim) milk. Reduced-fat, low-fat, or fat-free cheeses. Nonfat, low-sodium ricotta or cottage cheese. Low-fat or nonfat yogurt. Low-fat, low-sodium cheese. Fats and oils Soft margarine without trans fats. Vegetable oil. Reduced-fat, low-fat, or light mayonnaise and salad dressings (reduced-sodium). Canola, safflower, olive, avocado, soybean, and sunflower oils. Avocado. Seasonings and condiments Herbs. Spices. Seasoning mixes without salt. Other foods Unsalted popcorn and pretzels. Fat-free sweets. The items listed above may not be a complete list of foods and beverages you can eat. Contact a dietitian for more information. What foods should I avoid? Fruits Canned fruit in a light or heavy syrup. Fried fruit. Fruit in cream or butter sauce. Vegetables Creamed or fried vegetables. Vegetables in a cheese sauce. Regular canned vegetables (not low-sodium or reduced-sodium). Regular canned tomato sauce and paste (not low-sodium or reduced-sodium). Regular tomato and vegetable juice (not low-sodium or reduced-sodium). Rosita Fire. Olives. Grains Baked goods made with fat, such as croissants, muffins, or some breads. Dry pasta or rice meal packs. Meats and other proteins Fatty cuts of meat. Ribs.  Fried meat. Tomasa Blase. Bologna, salami, and other precooked or cured meats, such as sausages or meat loaves. Fat from the back of a pig (fatback). Bratwurst. Salted nuts and seeds. Canned beans with added salt. Canned or smoked fish. Whole eggs or egg yolks. Chicken or [place] with skin. Dairy Whole or 2% milk, cream, and half-and-half. Whole or full-fat cream cheese. Whole-fat or sweetened yogurt. Full-fat cheese. Nondairy creamers. Whipped toppings. Processed cheese and cheese spreads. Fats and oils Butter. Stick margarine. Lard. Shortening. Ghee. Bacon fat. Tropical oils, such as coconut, palm kernel, or palm oil. Seasonings and condiments Onion salt, garlic salt, seasoned salt, table salt, and sea salt. Worcestershire sauce. Tartar sauce. Barbecue sauce. Teriyaki sauce. Soy sauce, including reduced-sodium. Steak sauce. Canned and packaged gravies. Fish sauce. Oyster sauce. Cocktail sauce. Store-bought horseradish. Ketchup. Mustard. Meat flavorings and tenderizers. Bouillon cubes. Hot sauces. Pre-made or packaged marinades. Pre-made or packaged taco seasonings. Relishes. Regular salad dressings. Other foods Salted popcorn and pretzels. The items listed above may not be a complete list of foods and beverages you should avoid. Contact a dietitian for more information. Where to find more information  National Heart, Lung, and Blood Institute: PopSteam.is  American Heart Association: www.heart.org  Academy of Nutrition and Dietetics: www.eatright.org  National Kidney Foundation: www.kidney.org  Summary  The DASH eating plan is a healthy eating plan that has been shown to reduce high blood pressure (hypertension). It may also reduce your risk for type 2 diabetes, heart disease, and stroke.  When on the DASH eating plan, aim to eat more fresh fruits and vegetables, whole grains, lean proteins, low-fat dairy, and heart-healthy fats.  With the DASH eating plan, you should limit salt (sodium)  intake to 2,300 mg a day. If you have hypertension, you may need to reduce your sodium intake to 1,500 mg a day.  Work with your health care provider or dietitian to adjust your eating plan to your individual calorie needs. This information is not intended to replace advice given to you by your health care provider. Make sure you discuss any questions you have with your health care provider. Document Revised: 02/24/2019 Document Reviewed: 02/24/2019 Elsevier Patient Education  2021 [employer].

## 2020-07-12 NOTE — Progress Notes (Signed)
Established Patient Office Visit  Subjective:  Patient ID: Regina Castillo, female    DOB: 1970/04/09  Age: 50 y.o. MRN: [id number]  CC:  Chief Complaint  Patient presents with  . Hypertension    3 month follow up- doesn't think she took her bp med lastnight     HPI Regina Castillo is a 50 year old female with PMH of hypertension, GERD, and chronic neck pain who presents for follow up of chronic medical conditions.  Her BP was elevated today. She states that she might have forgotten her Amlodipine dose yesterday. Her BP was 138/78 at her nurse visit at her workplace. She has been dealing with work-related stress as well. She denies any headache, dizziness, chest pain, dyspnea or palpitations.  She saw Neurosurgeon for her DDD of cervical spine. Her neck pain is stable overall for now. She takes Flexeril PRN.  She has been anxious due to work related stress. She has been trying relaxation techniques. Wants to avoid any medication for now.  Past Medical History:  Diagnosis Date  . Cyst of ovary, left   . Fatty liver   . Hypertension   . Irritable bowel syndrome    Never fully evaluated for this.    Past Surgical History:  Procedure Laterality Date  . ABDOMINAL HYSTERECTOMY    . CESAREAN SECTION      Family History  Problem Relation Age of Onset  . Diabetes Father   . Diabetes Mother   . Hypertension Mother   . Colon cancer Neg Hx     Social History   Socioeconomic History  . Marital status: Single    Spouse name: Not on file  . Number of children: 3  . Years of education: Not on file  . Highest education level: Not on file  Occupational History  . Occupation: [employer]  Tobacco Use  . Smoking status: Current Some Day Smoker    Packs/day: 0.25    Years: 15.00    Pack years: 3.75    Types: Cigarettes  . Smokeless tobacco: Never Used  Vaping Use  . Vaping Use: Never used  Substance and Sexual Activity  . Alcohol use: Yes    Comment: occasionally  . Drug  use: No  . Sexual activity: Yes    Birth control/protection: Surgical    Comment: hyst  Other Topics Concern  . Not on file  Social History Narrative   Works for [employer]. Single, not dating. Live with mother. Has grown children, 3 boys.  Eats meat fruits and vegetables.  Wears seatbelt.  Currently lives with mother.   Social Determinants of Health   Financial Resource Strain: Low Risk   . Difficulty of Paying Living Expenses: Not very hard  Food Insecurity: No Food Insecurity  . Worried About Programme researcher, broadcasting/film/video in the Last Year: Never true  . Ran Out of Food in the Last Year: Never true  Transportation Needs: No Transportation Needs  . Lack of Transportation (Medical): No  . Lack of Transportation (Non-Medical): No  Physical Activity: Insufficiently Active  . Days of Exercise per Week: 2 days  . Minutes of Exercise per Session: 50 min  Stress: No Stress Concern Present  . Feeling of Stress : Not at all  Social Connections: Moderately Integrated  . Frequency of Communication with Friends and Family: More than three times a week  . Frequency of Social Gatherings with Friends and Family: More than three times a week  . Attends Religious Services: More than  4 times per year  . Active Member of Clubs or Organizations: Yes  . Attends Banker Meetings: 1 to 4 times per year  . Marital Status: Never married  Intimate Partner Violence: Not At Risk  . Fear of Current or Ex-Partner: No  . Emotionally Abused: No  . Physically Abused: No  . Sexually Abused: No    Outpatient Medications Prior to Visit  Medication Sig Dispense Refill  . cyclobenzaprine (FLEXERIL) 10 MG tablet Take 10 mg by mouth at bedtime.    . fexofenadine-pseudoephedrine (ALLEGRA-D) 60-120 MG 12 hr tablet Take 1 tablet by mouth 2 (two) times daily.    . fluticasone (FLONASE) 50 MCG/ACT nasal spray Place into both nostrils.    Marland Kitchen omeprazole (PRILOSEC) 40 MG capsule Take 1 capsule (40 mg total) by mouth daily.  90 capsule 3  . amLODipine (NORVASC) 5 MG tablet Take 5 mg by mouth daily.   3   No facility-administered medications prior to visit.    No Known Allergies  ROS Review of Systems  Constitutional: Negative for chills and fever.  HENT: Negative for congestion, sinus pressure, sinus pain and sore throat.   Eyes: Negative for pain and discharge.  Respiratory: Negative for cough and shortness of breath.   Cardiovascular: Negative for chest pain and palpitations.  Gastrointestinal: Negative for abdominal pain, constipation, diarrhea, nausea and vomiting.  Endocrine: Negative for polydipsia and polyuria.  Genitourinary: Negative for dysuria and hematuria.  Musculoskeletal: Positive for back pain and neck pain. Negative for neck stiffness.  Skin: Negative for rash.  Neurological: Negative for dizziness, syncope, weakness, numbness and headaches.  Psychiatric/Behavioral: Negative for agitation and behavioral problems. The patient is nervous/anxious.       Objective:    Physical Exam Vitals reviewed.  Constitutional:      General: She is not in acute distress.    Appearance: She is not diaphoretic.  HENT:     Head: Normocephalic and atraumatic.     Nose: Nose normal.     Mouth/Throat:     Mouth: Mucous membranes are moist.  Eyes:     General: No scleral icterus.    Extraocular Movements: Extraocular movements intact.  Cardiovascular:     Rate and Rhythm: Normal rate and regular rhythm.     Pulses: Normal pulses.     Heart sounds: Normal heart sounds. No murmur heard.   Pulmonary:     Breath sounds: Normal breath sounds. No wheezing or rales.  Abdominal:     Palpations: Abdomen is soft.     Tenderness: There is no abdominal tenderness.  Musculoskeletal:        General: Tenderness (Cervical spinal area, no erythema or warmth) present.     Cervical back: Neck supple. No tenderness.     Right lower leg: No edema.     Left lower leg: No edema.  Skin:    General: Skin is  warm.     Findings: No rash.  Neurological:     General: No focal deficit present.     Mental Status: She is alert and oriented to person, place, and time.  Psychiatric:        Mood and Affect: Mood normal.        Behavior: Behavior normal.     BP (!) 160/100   Pulse 84   Resp 15   Ht 5' (1.524 m)   Wt 139 lb (63 kg)   SpO2 97%   BMI 27.15 kg/m  Wt Readings from  Last 3 Encounters:  07/12/20 139 lb (63 kg)  03/15/20 142 lb 6.4 oz (64.6 kg)  08/09/19 148 lb (67.1 kg)     Health Maintenance Due  Topic Date Due  . Hepatitis C Screening  Never done  . HIV Screening  Never done  . COLONOSCOPY (Pts 45-68yrs Insurance coverage will need to be confirmed)  Never done    There are no preventive care reminders to display for this patient.  Lab Results  Component Value Date   TSH 1.910 12/07/2017   Lab Results  Component Value Date   WBC 10.8 (H) 10/22/2006   HGB 10.7 (L) 10/22/2006   HCT 31.5 (L) 10/22/2006   MCV 92.2 10/22/2006   PLT 328 10/22/2006   Lab Results  Component Value Date   NA 136 10/11/2006   K 4.0 10/11/2006   CO2 29 10/11/2006   GLUCOSE 85 10/11/2006   BUN 12 10/11/2006   CREATININE 0.48 10/11/2006   BILITOT 0.4 10/11/2006   ALKPHOS 59 10/11/2006   AST 18 10/11/2006   ALT 12 10/11/2006   PROT 6.7 10/11/2006   ALBUMIN 3.9 10/11/2006   CALCIUM 9.1 10/11/2006   Lab Results  Component Value Date   CHOL 185 02/12/2015   Lab Results  Component Value Date   HDL 86 02/12/2015   Lab Results  Component Value Date   LDLCALC 83 02/12/2015   Lab Results  Component Value Date   TRIG 78 02/12/2015   Lab Results  Component Value Date   CHOLHDL 2.2 02/12/2015   No results found for: HGBA1C    Assessment & Plan:   Problem List Items Addressed This Visit      Cardiovascular and Mediastinum   Primary hypertension - Primary    BP Readings from Last 1 Encounters:  07/12/20 (!) 160/100   Uncontrolled with Amlodipine 5 mg  QD Anxiety/work-related stress and noncompliance contributing Added HCTZ 12.5 mg QD Counseled for compliance with the medications Advised DASH diet and moderate exercise/walking, at least 150 mins/week       Relevant Medications   hydrochlorothiazide (MICROZIDE) 12.5 MG capsule   amLODipine (NORVASC) 5 MG tablet     Musculoskeletal and Integument   DDD (degenerative disc disease), cervical    Saw Neurosurgeon Conservative management for now Flexeril PRN        Other   Anxiety    Prefers to try relaxation techniques Prefers to avoid any medication         Meds ordered this encounter  Medications  . hydrochlorothiazide (MICROZIDE) 12.5 MG capsule    Sig: Take 1 capsule (12.5 mg total) by mouth daily.    Dispense:  90 capsule    Refill:  0  . amLODipine (NORVASC) 5 MG tablet    Sig: Take 1 tablet (5 mg total) by mouth daily.    Dispense:  90 tablet    Refill:  1    Follow-up: Return in about 6 months (around 01/11/2021) for HTN and anxiety.    Anabel Halon, MD

## 2020-07-12 NOTE — Assessment & Plan Note (Signed)
BP Readings from Last 1 Encounters:  07/12/20 (!) 160/100   Uncontrolled with Amlodipine 5 mg QD Anxiety/work-related stress and noncompliance contributing Added HCTZ 12.5 mg QD Counseled for compliance with the medications Advised DASH diet and moderate exercise/walking, at least 150 mins/week

## 2020-07-12 NOTE — Assessment & Plan Note (Signed)
Saw Neurosurgeon Conservative management for now Flexeril PRN

## 2020-07-20 ENCOUNTER — Other Ambulatory Visit: Payer: Self-pay | Admitting: Gastroenterology

## 2020-07-20 DIAGNOSIS — R140 Abdominal distension (gaseous): Secondary | ICD-10-CM

## 2020-07-20 DIAGNOSIS — K219 Gastro-esophageal reflux disease without esophagitis: Secondary | ICD-10-CM

## 2020-07-24 ENCOUNTER — Other Ambulatory Visit: Payer: Self-pay | Admitting: *Deleted

## 2020-07-24 DIAGNOSIS — K219 Gastro-esophageal reflux disease without esophagitis: Secondary | ICD-10-CM

## 2020-07-24 DIAGNOSIS — R140 Abdominal distension (gaseous): Secondary | ICD-10-CM

## 2020-07-24 MED ORDER — OMEPRAZOLE 40 MG PO CPDR: 40.0000 mg | DELAYED_RELEASE_CAPSULE | Freq: Every day | ORAL | 3 refills | Status: DC

## 2020-10-16 ENCOUNTER — Telehealth: Payer: Self-pay

## 2020-10-16 ENCOUNTER — Other Ambulatory Visit: Payer: Self-pay | Admitting: *Deleted

## 2020-10-16 DIAGNOSIS — I10 Essential (primary) hypertension: Secondary | ICD-10-CM

## 2020-10-16 MED ORDER — HYDROCHLOROTHIAZIDE 12.5 MG PO CAPS: 12.5000 mg | ORAL_CAPSULE | Freq: Every day | ORAL | 0 refills | Status: DC

## 2020-10-16 NOTE — Telephone Encounter (Signed)
Patient called need med refill  hydrochlorothiazide (MICROZIDE) 12.5 MG capsule  Pharmacy: [facility]

## 2020-10-16 NOTE — Telephone Encounter (Signed)
Pt medication sent to pharmacy  

## 2020-11-22 ENCOUNTER — Other Ambulatory Visit: Payer: Self-pay | Admitting: Internal Medicine

## 2020-11-22 DIAGNOSIS — Z1231 Encounter for screening mammogram for malignant neoplasm of breast: Secondary | ICD-10-CM

## 2020-11-25 ENCOUNTER — Ambulatory Visit (INDEPENDENT_AMBULATORY_CARE_PROVIDER_SITE_OTHER): Payer: Commercial Managed Care - PPO | Admitting: Internal Medicine

## 2020-11-25 ENCOUNTER — Other Ambulatory Visit: Payer: Self-pay

## 2020-11-25 ENCOUNTER — Encounter: Payer: Self-pay | Admitting: Internal Medicine

## 2020-11-25 VITALS — BP 136/86 | HR 91 | Temp 97.8°F | Ht 60.0 in | Wt 139.0 lb

## 2020-11-25 DIAGNOSIS — Z1211 Encounter for screening for malignant neoplasm of colon: Secondary | ICD-10-CM

## 2020-11-25 DIAGNOSIS — I10 Essential (primary) hypertension: Secondary | ICD-10-CM

## 2020-11-25 DIAGNOSIS — G609 Hereditary and idiopathic neuropathy, unspecified: Secondary | ICD-10-CM

## 2020-11-25 DIAGNOSIS — Z1212 Encounter for screening for malignant neoplasm of rectum: Secondary | ICD-10-CM

## 2020-11-25 MED ORDER — AMLODIPINE BESYLATE 5 MG PO TABS: 5.0000 mg | ORAL_TABLET | Freq: Every day | ORAL | 1 refills | Status: DC

## 2020-11-25 MED ORDER — HYDROCHLOROTHIAZIDE 12.5 MG PO CAPS: 12.5000 mg | ORAL_CAPSULE | Freq: Every day | ORAL | 1 refills | Status: DC

## 2020-11-25 NOTE — Patient Instructions (Signed)
Please continue taking medications as prescribed.  Please continue to follow low salt diet and perform moderate exercise/walking at least 150 mins/week. 

## 2020-11-25 NOTE — Assessment & Plan Note (Signed)
BP Readings from Last 1 Encounters:  11/25/20 136/86   Well-controlled with Amlodipine 5 mg QD and HCTZ 12.5 mg QD now Counseled for compliance with the medications Advised DASH diet and moderate exercise/walking, at least 150 mins/week

## 2020-11-25 NOTE — Assessment & Plan Note (Signed)
Has intermittent ankle pain and foot pain, especially at night Could be MSK related in addition to neuropathic pain She prefers to avoid medications Work note provided to allow her to wear soft and wide based shoes

## 2020-11-25 NOTE — Progress Notes (Signed)
Established Patient Office Visit  Subjective:  Patient ID: Regina Castillo, female    DOB: 1970/05/16  Age: 50 y.o. MRN: [id number]  CC:  Chief Complaint  Patient presents with   Hypertension    HPI Regina Castillo is a 50 year old female with PMH of hypertension, GERD, and chronic neck pain who presents for follow up of chronic medical conditions.   BP is well-controlled now Amlodipine and HCTZ. Takes medications regularly. Patient denies headache, dizziness, chest pain, dyspnea or palpitations.  She has been trying relaxation techniques for work related stress.  She c/o b/l foot and ankle pain, which is chronic, intermittent and happens at night as well. She prefers to avoid any medication for it. 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 She states that her pain is better if she wears sneakers. Has intermittent numbness of the feet, but denies any weakness.   Past Medical History:  Diagnosis Date   Cyst of ovary, left    Fatty liver    Hypertension    Irritable bowel syndrome    Never fully evaluated for this.    Past Surgical History:  Procedure Laterality Date   ABDOMINAL HYSTERECTOMY     CESAREAN SECTION      Family History  Problem Relation Age of Onset   Diabetes Father    Diabetes Mother    Hypertension Mother    Colon cancer Neg Hx     Social History   Socioeconomic History   Marital status: Single    Spouse name: Not on file   Number of children: 3   Years of education: Not on file   Highest education level: Not on file  Occupational History   Occupation: [employer]  Tobacco Use   Smoking status: Some Days    Packs/day: 0.25    Years: 15.00    Pack years: 3.75    Types: Cigarettes   Smokeless tobacco: Never  Vaping Use   Vaping Use: Never used  Substance and Sexual Activity   Alcohol use: Yes    Comment: occasionally   Drug  use: No   Sexual activity: Yes    Birth control/protection: Surgical    Comment: hyst  Other Topics Concern   Not on file  Social History Narrative   Works for [employer]. Single, not dating. Live with mother. Has grown children, 3 boys.  Eats meat fruits and vegetables.  Wears seatbelt.  Currently lives with mother.   Social Determinants of Health   Financial Resource Strain: Not on file  Food Insecurity: Not on file  Transportation Needs: Not on file  Physical Activity: Not on file  Stress: Not on file  Social Connections: Not on file  Intimate Partner Violence: Not on file    Outpatient Medications Prior to Visit  Medication Sig Dispense Refill   cyclobenzaprine (FLEXERIL) 10 MG tablet Take 10 mg by mouth at bedtime.     fexofenadine-pseudoephedrine (ALLEGRA-D) 60-120 MG 12 hr tablet Take 1 tablet by mouth 2 (two) times daily.     fluticasone (FLONASE) 50 MCG/ACT nasal spray Place into both nostrils.     omeprazole (PRILOSEC) 40 MG capsule Take 1 capsule by mouth once daily 90 capsule 0   omeprazole (PRILOSEC) 40 MG capsule Take 1 capsule (40 mg total) by mouth daily. 90 capsule 3   amLODipine (NORVASC) 5  MG tablet Take 1 tablet (5 mg total) by mouth daily. 90 tablet 1   hydrochlorothiazide (MICROZIDE) 12.5 MG capsule Take 1 capsule (12.5 mg total) by mouth daily. 90 capsule 0   No facility-administered medications prior to visit.    No Known Allergies  ROS Review of Systems  Constitutional:  Negative for chills and fever.  HENT:  Negative for congestion, sinus pressure, sinus pain and sore throat.   Eyes:  Negative for pain and discharge.  Respiratory:  Negative for cough and shortness of breath.   Cardiovascular:  Negative for chest pain and palpitations.  Gastrointestinal:  Negative for abdominal pain, constipation, diarrhea, nausea and vomiting.  Endocrine: Negative for polydipsia and polyuria.  Genitourinary:  Negative for dysuria and hematuria.  Musculoskeletal:   Positive for back pain and neck pain. Negative for neck stiffness.  Skin:  Negative for rash.  Neurological:  Negative for dizziness, syncope, weakness, numbness and headaches.  Psychiatric/Behavioral:  Negative for agitation and behavioral problems. The patient is nervous/anxious.      Objective:    Physical Exam Vitals reviewed.  Constitutional:      General: She is not in acute distress.    Appearance: She is not diaphoretic.  HENT:     Head: Normocephalic and atraumatic.     Nose: Nose normal.     Mouth/Throat:     Mouth: Mucous membranes are moist.  Eyes:     General: No scleral icterus.    Extraocular Movements: Extraocular movements intact.  Cardiovascular:     Rate and Rhythm: Normal rate and regular rhythm.     Pulses: Normal pulses.     Heart sounds: Normal heart sounds. No murmur heard. Pulmonary:     Breath sounds: Normal breath sounds. No wheezing or rales.  Abdominal:     Palpations: Abdomen is soft.     Tenderness: There is no abdominal tenderness.  Musculoskeletal:     Cervical back: Neck supple. No tenderness.     Right lower leg: No edema.     Left lower leg: No edema.  Skin:    General: Skin is warm.     Findings: No rash.  Neurological:     General: No focal deficit present.     Mental Status: She is alert and oriented to person, place, and time.  Psychiatric:        Mood and Affect: Mood normal.        Behavior: Behavior normal.    BP 136/86   Pulse 91   Temp 97.8 F (36.6 C)   Ht 5' (1.524 m)   Wt 139 lb (63 kg)   SpO2 99%   BMI 27.15 kg/m  Wt Readings from Last 3 Encounters:  11/25/20 139 lb (63 kg)  07/12/20 139 lb (63 kg)  03/15/20 142 lb 6.4 oz (64.6 kg)     Health Maintenance Due  Topic Date Due   Pneumococcal Vaccine 51-41 Years old (1 - PCV) Never done   HIV Screening  Never done   Hepatitis C Screening  Never done   COLONOSCOPY (Pts 45-66yrs Insurance coverage will need to be confirmed)  Never done   Zoster Vaccines-  Shingrix (1 of 2) Never done   COVID-19 Vaccine (3 - Booster for Pfizer series) 06/18/2020   INFLUENZA VACCINE  11/04/2020    There are no preventive care reminders to display for this patient.  Lab Results  Component Value Date   TSH 1.910 12/07/2017   Lab Results  Component  Value Date   WBC 10.8 (H) 10/22/2006   HGB 10.7 (L) 10/22/2006   HCT 31.5 (L) 10/22/2006   MCV 92.2 10/22/2006   PLT 328 10/22/2006   Lab Results  Component Value Date   NA 136 10/11/2006   K 4.0 10/11/2006   CO2 29 10/11/2006   GLUCOSE 85 10/11/2006   BUN 12 10/11/2006   CREATININE 0.48 10/11/2006   BILITOT 0.4 10/11/2006   ALKPHOS 59 10/11/2006   AST 18 10/11/2006   ALT 12 10/11/2006   PROT 6.7 10/11/2006   ALBUMIN 3.9 10/11/2006   CALCIUM 9.1 10/11/2006   Lab Results  Component Value Date   CHOL 185 02/12/2015   Lab Results  Component Value Date   HDL 86 02/12/2015   Lab Results  Component Value Date   LDLCALC 83 02/12/2015   Lab Results  Component Value Date   TRIG 78 02/12/2015   Lab Results  Component Value Date   CHOLHDL 2.2 02/12/2015   No results found for: HGBA1C    Assessment & Plan:   Problem List Items Addressed This Visit       Cardiovascular and Mediastinum   Primary hypertension - Primary    BP Readings from Last 1 Encounters:  11/25/20 136/86  Well-controlled with Amlodipine 5 mg QD and HCTZ 12.5 mg QD now Counseled for compliance with the medications Advised DASH diet and moderate exercise/walking, at least 150 mins/week       Relevant Medications   amLODipine (NORVASC) 5 MG tablet   hydrochlorothiazide (MICROZIDE) 12.5 MG capsule     Nervous and Auditory   Idiopathic peripheral neuropathy    Has intermittent ankle pain and foot pain, especially at night Could be MSK related in addition to neuropathic pain She prefers to avoid medications Work note provided to allow her to wear soft and wide based shoes        Other   Screening for  colorectal cancer    Discussed about colonoscopy Patient prefers stool test for now - ordered Cologuard      Relevant Orders   Cologuard    Meds ordered this encounter  Medications   amLODipine (NORVASC) 5 MG tablet    Sig: Take 1 tablet (5 mg total) by mouth daily.    Dispense:  90 tablet    Refill:  1   hydrochlorothiazide (MICROZIDE) 12.5 MG capsule    Sig: Take 1 capsule (12.5 mg total) by mouth daily.    Dispense:  90 capsule    Refill:  1    Follow-up: Return in about 6 months (around 05/28/2021) for Annual physical.    Anabel Halon, MD

## 2020-11-25 NOTE — Assessment & Plan Note (Signed)
Discussed about colonoscopy Patient prefers stool test for now - ordered Cologuard

## 2020-12-14 ENCOUNTER — Other Ambulatory Visit: Payer: Self-pay | Admitting: Gastroenterology

## 2020-12-14 DIAGNOSIS — R140 Abdominal distension (gaseous): Secondary | ICD-10-CM

## 2020-12-14 DIAGNOSIS — K219 Gastro-esophageal reflux disease without esophagitis: Secondary | ICD-10-CM

## 2020-12-14 LAB — COLOGUARD: Cologuard: NEGATIVE

## 2020-12-17 ENCOUNTER — Telehealth: Payer: Self-pay

## 2020-12-17 NOTE — Telephone Encounter (Signed)
Patient returning call about lab results.  

## 2020-12-17 NOTE — Telephone Encounter (Signed)
Pt advised with verbal understanding  °

## 2020-12-19 ENCOUNTER — Other Ambulatory Visit: Payer: Self-pay

## 2020-12-19 ENCOUNTER — Ambulatory Visit
Admission: RE | Admit: 2020-12-19 | Discharge: 2020-12-19 | Disposition: A | Discharge status: Home / Self Care | Payer: Commercial Managed Care - PPO | Source: Ambulatory Visit | Attending: Internal Medicine | Admitting: Internal Medicine

## 2020-12-19 DIAGNOSIS — Z1231 Encounter for screening mammogram for malignant neoplasm of breast: Secondary | ICD-10-CM

## 2020-12-24 ENCOUNTER — Encounter: Payer: Self-pay | Admitting: Obstetrics & Gynecology

## 2020-12-24 ENCOUNTER — Other Ambulatory Visit: Payer: Self-pay

## 2020-12-24 ENCOUNTER — Ambulatory Visit (INDEPENDENT_AMBULATORY_CARE_PROVIDER_SITE_OTHER): Payer: Commercial Managed Care - PPO | Admitting: Obstetrics & Gynecology

## 2020-12-24 VITALS — BP 138/91 | HR 86 | Ht 60.0 in | Wt 140.0 lb

## 2020-12-24 DIAGNOSIS — Z1212 Encounter for screening for malignant neoplasm of rectum: Secondary | ICD-10-CM

## 2020-12-24 DIAGNOSIS — Z01419 Encounter for gynecological examination (general) (routine) without abnormal findings: Secondary | ICD-10-CM | POA: Diagnosis not present

## 2020-12-24 DIAGNOSIS — Z1211 Encounter for screening for malignant neoplasm of colon: Secondary | ICD-10-CM

## 2020-12-24 NOTE — Progress Notes (Signed)
Subjective:     Regina Castillo is a 50 y.o. female here for a routine exam.  No LMP recorded. Patient has had a hysterectomy. [id number] Birth Control Method:  hysterectomy Menstrual Calendar(currently): amenorrheic  Current complaints: none.   Current acute medical issues:  none   Recent Gynecologic History No LMP recorded. Patient has had a hysterectomy. Last Pap: prior to hysterectomy,  normal Last mammogram: 9/22,  normal  Past Medical History:  Diagnosis Date   Cyst of ovary, left    Fatty liver    Hypertension    Irritable bowel syndrome    Never fully evaluated for this.    Past Surgical History:  Procedure Laterality Date   ABDOMINAL HYSTERECTOMY     CESAREAN SECTION      OB History     Gravida  3   Para  3   Term  3   Preterm      AB      Living  3      SAB      IAB      Ectopic      Multiple      Live Births  3           Social History   Socioeconomic History   Marital status: Single    Spouse name: Not on file   Number of children: 3   Years of education: Not on file   Highest education level: Not on file  Occupational History   Occupation: [employer]  Tobacco Use   Smoking status: Some Days    Packs/day: 0.25    Years: 15.00    Pack years: 3.75    Types: Cigarettes   Smokeless tobacco: Never  Vaping Use   Vaping Use: Never used  Substance and Sexual Activity   Alcohol use: Yes    Comment: occasionally   Drug use: No   Sexual activity: Yes    Birth control/protection: Surgical    Comment: hyst  Other Topics Concern   Not on file  Social History Narrative   Works for [employer]. Single, not dating. Live with mother. Has grown children, 3 boys.  Eats meat fruits and vegetables.  Wears seatbelt.  Currently lives with mother.   Social Determinants of Health   Financial Resource Strain: Low Risk    Difficulty of Paying Living Expenses: Not hard at all  Food Insecurity: No Food Insecurity   Worried About Programme researcher, broadcasting/film/video in the  Last Year: Never true   Ran Out of Food in the Last Year: Never true  Transportation Needs: No Transportation Needs   Lack of Transportation (Medical): No   Lack of Transportation (Non-Medical): No  Physical Activity: Insufficiently Active   Days of Exercise per Week: 1 day   Minutes of Exercise per Session: 10 min  Stress: No Stress Concern Present   Feeling of Stress : Not at all  Social Connections: Moderately Integrated   Frequency of Communication with Friends and Family: More than three times a week   Frequency of Social Gatherings with Friends and Family: Twice a week   Attends Religious Services: More than 4 times per year   Active Member of [employer] or Organizations: Yes   Attends Banker Meetings: 1 to 4 times per year   Marital Status: Never married    Family History  Problem Relation Age of Onset   Diabetes Father    Diabetes Mother    Hypertension Mother  Colon cancer Neg Hx      Current Outpatient Medications:    amLODipine (NORVASC) 5 MG tablet, Take 1 tablet (5 mg total) by mouth daily., Disp: 90 tablet, Rfl: 1   Biotin w/ Vitamins C & E (HAIR/SKIN/NAILS PO), Take by mouth., Disp: , Rfl:    ELDERBERRY PO, Take by mouth., Disp: , Rfl:    Fexofenadine HCl (ALLEGRA PO), Take by mouth., Disp: , Rfl:    hydrochlorothiazide (MICROZIDE) 12.5 MG capsule, Take 1 capsule (12.5 mg total) by mouth daily., Disp: 90 capsule, Rfl: 1   omeprazole (PRILOSEC) 40 MG capsule, Take 1 capsule (40 mg total) by mouth daily., Disp: 90 capsule, Rfl: 3   traMADol (ULTRAM) 50 MG tablet, , Disp: , Rfl:   Review of Systems  Review of Systems  Constitutional: Negative for fever, chills, weight loss, malaise/fatigue and diaphoresis.  HENT: Negative for hearing loss, ear pain, nosebleeds, congestion, sore throat, neck pain, tinnitus and ear discharge.   Eyes: Negative for blurred vision, double vision, photophobia, pain, discharge and redness.  Respiratory: Negative for cough,  hemoptysis, sputum production, shortness of breath, wheezing and stridor.   Cardiovascular: Negative for chest pain, palpitations, orthopnea, claudication, leg swelling and PND.  Gastrointestinal: negative for abdominal pain. Negative for heartburn, nausea, vomiting, diarrhea, constipation, blood in stool and melena.  Genitourinary: Negative for dysuria, urgency, frequency, hematuria and flank pain.  Musculoskeletal: Negative for myalgias, back pain, joint pain and falls.  Skin: Negative for itching and rash.  Neurological: Negative for dizziness, tingling, tremors, sensory change, speech change, focal weakness, seizures, loss of consciousness, weakness and headaches.  Endo/Heme/Allergies: Negative for environmental allergies and polydipsia. Does not bruise/bleed easily.  Psychiatric/Behavioral: Negative for depression, suicidal ideas, hallucinations, memory loss and substance abuse. The patient is not nervous/anxious and does not have insomnia.        Objective:  Blood pressure (!) 138/91, pulse 86, height 5' (1.524 m), weight 140 lb (63.5 kg).   Physical Exam  Vitals reviewed. Constitutional: She is oriented to person, place, and time. She appears well-developed and well-nourished.  HENT:  Head: Normocephalic and atraumatic.        Right Ear: External ear normal.  Left Ear: External ear normal.  Nose: Nose normal.  Mouth/Throat: Oropharynx is clear and moist.  Eyes: Conjunctivae and EOM are normal. Pupils are equal, round, and reactive to light. Right eye exhibits no discharge. Left eye exhibits no discharge. No scleral icterus.  Neck: Normal range of motion. Neck supple. No tracheal deviation present. No thyromegaly present.  Cardiovascular: Normal rate, regular rhythm, normal heart sounds and intact distal pulses.  Exam reveals no gallop and no friction rub.   No murmur heard. Respiratory: Effort normal and breath sounds normal. No respiratory distress. She has no wheezes. She has no  rales. She exhibits no tenderness.  GI: Soft. Bowel sounds are normal. She exhibits no distension and no mass. There is no tenderness. There is no rebound and no guarding.  Genitourinary:  Breasts no masses skin changes or nipple changes bilaterally      Vulva is normal without lesions Vagina is pink moist without discharge Cervix absent Uterus is absent Adnexa is negative  {Rectal    hemoccult negative, normal tone, no masses  Musculoskeletal: Normal range of motion. She exhibits no edema and no tenderness.  Neurological: She is alert and oriented to person, place, and time. She has normal reflexes. She displays normal reflexes. No cranial nerve deficit. She exhibits normal muscle tone. Coordination  normal.  Skin: Skin is warm and dry. No rash noted. No erythema. No pallor.  Psychiatric: She has a normal mood and affect. Her behavior is normal. Judgment and thought content normal.       Medications Ordered at today's visit: No orders of the defined types were placed in this encounter.   Other orders placed at today's visit: No orders of the defined types were placed in this encounter.     Assessment:    Normal Gyn exam.   S/P hysterectomy, menopausal symptoms Plan:    Contraception: status post hysterectomy. Hormone replacement therapy: none. Follow up in: 3 years.     Return in about 3 years (around 12/25/2023) for yearly.

## 2021-01-17 ENCOUNTER — Other Ambulatory Visit: Payer: Self-pay

## 2021-01-17 ENCOUNTER — Ambulatory Visit (INDEPENDENT_AMBULATORY_CARE_PROVIDER_SITE_OTHER): Payer: Commercial Managed Care - PPO | Admitting: Internal Medicine

## 2021-01-17 ENCOUNTER — Encounter: Payer: Self-pay | Admitting: Internal Medicine

## 2021-01-17 VITALS — BP 144/74 | HR 86 | Temp 98.2°F | Resp 16 | Ht 60.0 in | Wt 140.1 lb

## 2021-01-17 DIAGNOSIS — J302 Other seasonal allergic rhinitis: Secondary | ICD-10-CM | POA: Diagnosis not present

## 2021-01-17 DIAGNOSIS — K219 Gastro-esophageal reflux disease without esophagitis: Secondary | ICD-10-CM | POA: Diagnosis not present

## 2021-01-17 DIAGNOSIS — I10 Essential (primary) hypertension: Secondary | ICD-10-CM

## 2021-01-17 DIAGNOSIS — E782 Mixed hyperlipidemia: Secondary | ICD-10-CM | POA: Diagnosis not present

## 2021-01-17 MED ORDER — FLUTICASONE PROPIONATE 50 MCG/ACT NA SUSP
2.0000 | Freq: Every day | NASAL | 6 refills | Status: DC
Start: 2021-01-17 — End: 2021-12-30

## 2021-01-17 NOTE — Assessment & Plan Note (Signed)
BP Readings from Last 1 Encounters:  01/17/21 (!) 144/74   Elevated today, could be due to NSAID intake recently Usually well-controlled with Amlodipine 5 mg QD and HCTZ 12.5 mg QD Counseled for compliance with the medications Advised DASH diet and moderate exercise/walking, at least 150 mins/week

## 2021-01-17 NOTE — Assessment & Plan Note (Signed)
On Omeprazole 

## 2021-01-17 NOTE — Progress Notes (Signed)
Established Patient Office Visit  Subjective:  Patient ID: Regina Castillo, female    DOB: [birth year]  Age: 50 y.o. MRN: [id number]  CC:  Chief Complaint  Patient presents with   Follow-up    6 month follow up for 1 week pt has had coughing sneezing and drainage no covid test has taken allegra     HPI Regina Castillo is a 50 year old female with PMH of hypertension, GERD, and chronic neck pain who presents for follow up of chronic medical conditions.   HTN: BP was elevated today. Of note, she has been having sneezing and coughing for more than a week and she has been taking Aleve for headache/sinus pressure. Usually, her BP remains well-controlled now Amlodipine and HCTZ. Takes medications regularly. Patient denies dizziness, chest pain, dyspnea or palpitations.  She has been taking omeprazole for GERD.  Denies any dysphagia or odynophagia.  She has been having sneezing, nasal congestion and coughing with ear fullness for more than a week now.  She has a history of allergic rhinitis, for which she takes Human resources officer.  She also has taken Aleve for sinus pressure related headache.  She denies any fever, chills, nausea or vomiting.  Denies any ear discharge.  Denies any dyspnea or wheezing.  Past Medical History:  Diagnosis Date   Cyst of ovary, left    Fatty liver    Hypertension    Irritable bowel syndrome    Never fully evaluated for this.    Past Surgical History:  Procedure Laterality Date   ABDOMINAL HYSTERECTOMY     CESAREAN SECTION      Family History  Problem Relation Age of Onset   Diabetes Father    Diabetes Mother    Hypertension Mother    Colon cancer Neg Hx     Social History   Socioeconomic History   Marital status: Single    Spouse name: Not on file   Number of children: 3   Years of education: Not on file   Highest education level: Not on file  Occupational History   Occupation: [employer]  Tobacco Use   Smoking status: Some Days    Packs/day: 0.25     Years: 15.00    Pack years: 3.75    Types: Cigarettes   Smokeless tobacco: Never  Vaping Use   Vaping Use: Never used  Substance and Sexual Activity   Alcohol use: Yes    Comment: occasionally   Drug use: No   Sexual activity: Yes    Birth control/protection: Surgical    Comment: hyst  Other Topics Concern   Not on file  Social History Narrative   Works for [employer] Single, not dating. Live with mother. Has grown children, 3 boys.  Eats meat fruits and vegetables.  Wears seatbelt.  Currently lives with mother.   Social Determinants of Health   Financial Resource Strain: Low Risk    Difficulty of Paying Living Expenses: Not hard at all  Food Insecurity: No Food Insecurity   Worried About Charity fundraiser in the Last Year: Never true   Catharine in the Last Year: Never true  Transportation Needs: No Transportation Needs   Lack of Transportation (Medical): No   Lack of Transportation (Non-Medical): No  Physical Activity: Insufficiently Active   Days of Exercise per Week: 1 day   Minutes of Exercise per Session: 10 min  Stress: No Stress Concern Present   Feeling of Stress : Not at all  Social  Connections: Moderately Integrated   Frequency of Communication with Friends and Family: More than three times a week   Frequency of Social Gatherings with Friends and Family: Twice a week   Attends Religious Services: More than 4 times per year   Active Member of [employer] or Organizations: Yes   Attends Archivist Meetings: 1 to 4 times per year   Marital Status: Never married  Human resources officer Violence: Not At Risk   Fear of Current or Ex-Partner: No   Emotionally Abused: No   Physically Abused: No   Sexually Abused: No    Outpatient Medications Prior to Visit  Medication Sig Dispense Refill   amLODipine (NORVASC) 5 MG tablet Take 1 tablet (5 mg total) by mouth daily. 90 tablet 1   Biotin w/ Vitamins C & E (HAIR/SKIN/NAILS PO) Take by mouth.     ELDERBERRY PO Take by  mouth.     Fexofenadine HCl (ALLEGRA PO) Take by mouth.     hydrochlorothiazide (MICROZIDE) 12.5 MG capsule Take 1 capsule (12.5 mg total) by mouth daily. 90 capsule 1   omeprazole (PRILOSEC) 40 MG capsule Take 1 capsule (40 mg total) by mouth daily. 90 capsule 3   traMADol (ULTRAM) 50 MG tablet  (Patient not taking: Reported on 01/17/2021)     No facility-administered medications prior to visit.    No Known Allergies  ROS Review of Systems  Constitutional:  Negative for chills and fever.  HENT:  Positive for congestion, postnasal drip and sinus pressure.   Eyes:  Negative for pain and discharge.  Respiratory:  Positive for cough. Negative for shortness of breath.   Cardiovascular:  Negative for chest pain and palpitations.  Gastrointestinal:  Negative for abdominal pain, diarrhea, nausea and vomiting.  Endocrine: Negative for polydipsia and polyuria.  Genitourinary:  Negative for dysuria and hematuria.  Musculoskeletal:  Positive for back pain and neck pain. Negative for neck stiffness.  Skin:  Negative for rash.  Neurological:  Negative for dizziness, syncope, weakness, numbness and headaches.  Psychiatric/Behavioral:  Negative for agitation and behavioral problems.      Objective:    Physical Exam Vitals reviewed.  Constitutional:      General: She is not in acute distress.    Appearance: She is not diaphoretic.  HENT:     Head: Normocephalic and atraumatic.     Right Ear: External ear normal. There is no impacted cerumen.     Left Ear: External ear normal. There is no impacted cerumen.     Nose: Congestion present.     Mouth/Throat:     Mouth: Mucous membranes are moist.  Eyes:     General: No scleral icterus.    Extraocular Movements: Extraocular movements intact.  Cardiovascular:     Rate and Rhythm: Normal rate and regular rhythm.     Pulses: Normal pulses.     Heart sounds: Normal heart sounds. No murmur heard. Pulmonary:     Breath sounds: Normal breath  sounds. No wheezing or rales.  Abdominal:     Palpations: Abdomen is soft.     Tenderness: There is no abdominal tenderness.  Musculoskeletal:     Cervical back: Neck supple. No tenderness.     Right lower leg: No edema.     Left lower leg: No edema.  Skin:    General: Skin is warm.     Findings: No rash.  Neurological:     General: No focal deficit present.     Mental Status: She  is alert and oriented to person, place, and time.  Psychiatric:        Mood and Affect: Mood normal.        Behavior: Behavior normal.    BP (!) 144/74 (BP Location: Right Arm, Cuff Size: Normal)   Pulse 86   Temp 98.2 F (36.8 C) (Oral)   Resp 16   Ht 5' (1.524 m)   Wt 140 lb 1.9 oz (63.6 kg)   SpO2 98%   BMI 27.37 kg/m  Wt Readings from Last 3 Encounters:  01/17/21 140 lb 1.9 oz (63.6 kg)  12/24/20 140 lb (63.5 kg)  11/25/20 139 lb (63 kg)     Health Maintenance Due  Topic Date Due   HIV Screening  Never done   Hepatitis C Screening  Never done   COLONOSCOPY (Pts 45-56yr Insurance coverage will need to be confirmed)  Never done   Zoster Vaccines- Shingrix (1 of 2) Never done   COVID-19 Vaccine (3 - Booster for Pfizer series) 06/18/2020    There are no preventive care reminders to display for this patient.  Lab Results  Component Value Date   TSH 1.910 12/07/2017   Lab Results  Component Value Date   WBC 10.8 (H) 10/22/2006   HGB 10.7 (L) 10/22/2006   HCT 31.5 (L) 10/22/2006   MCV 92.2 10/22/2006   PLT 328 10/22/2006   Lab Results  Component Value Date   NA 136 10/11/2006   K 4.0 10/11/2006   CO2 29 10/11/2006   GLUCOSE 85 10/11/2006   BUN 12 10/11/2006   CREATININE 0.48 10/11/2006   BILITOT 0.4 10/11/2006   ALKPHOS 59 10/11/2006   AST 18 10/11/2006   ALT 12 10/11/2006   PROT 6.7 10/11/2006   ALBUMIN 3.9 10/11/2006   CALCIUM 9.1 10/11/2006   Lab Results  Component Value Date   CHOL 185 02/12/2015   Lab Results  Component Value Date   HDL 86 02/12/2015    Lab Results  Component Value Date   LDLCALC 83 02/12/2015   Lab Results  Component Value Date   TRIG 78 02/12/2015   Lab Results  Component Value Date   CHOLHDL 2.2 02/12/2015   No results found for: HGBA1C    Assessment & Plan:   Problem List Items Addressed This Visit       Cardiovascular and Mediastinum   Primary hypertension - Primary    BP Readings from Last 1 Encounters:  01/17/21 (!) 144/74  Elevated today, could be due to NSAID intake recently Usually well-controlled with Amlodipine 5 mg QD and HCTZ 12.5 mg QD Counseled for compliance with the medications Advised DASH diet and moderate exercise/walking, at least 150 mins/week       Relevant Orders   CBC with Differential/Platelet   CMP14+EGFR   TSH     Respiratory   Seasonal allergic rhinitis    Takes Allegra Her recent symptoms more likely related to environmental allergies Started Flonase      Relevant Medications   fluticasone (FLONASE) 50 MCG/ACT nasal spray     Digestive   GERD (gastroesophageal reflux disease)    On Omeprazole      Other Visit Diagnoses     Mixed hyperlipidemia       Relevant Orders   Lipid panel       Meds ordered this encounter  Medications   fluticasone (FLONASE) 50 MCG/ACT nasal spray    Sig: Place 2 sprays into both nostrils daily.    Dispense:  16 g    Refill:  6    Follow-up: Return in about 6 months (around 07/18/2021) for HTN.    Lindell Spar, MD

## 2021-01-17 NOTE — Patient Instructions (Signed)
Please start using Flonase for allergic rhinitis/nasal congestion.  Please continue to take other medications as prescribed.

## 2021-01-17 NOTE — Assessment & Plan Note (Signed)
Takes Allegra Her recent symptoms more likely related to environmental allergies Started Flonase

## 2021-01-18 LAB — LIPID PANEL
Chol/HDL Ratio: 2.1 ratio (ref 0.0–4.4)
Cholesterol, Total: 206 mg/dL — ABNORMAL HIGH (ref 100–199)
HDL: 97 mg/dL (ref >39)
LDL Chol Calc (NIH): 89 mg/dL (ref 0–99)
Triglycerides: 115 mg/dL (ref 0–149)
VLDL Cholesterol Cal: 20 mg/dL (ref 5–40)

## 2021-01-18 LAB — CBC WITH DIFFERENTIAL/PLATELET
Basophils Absolute: 0.1 10*3/uL (ref 0.0–0.2)
Basos: 1 %
EOS (ABSOLUTE): 0.1 10*3/uL (ref 0.0–0.4)
Eos: 2 %
Hematocrit: 38 % (ref 34.0–46.6)
Hemoglobin: 13.6 g/dL (ref 11.1–15.9)
Immature Grans (Abs): 0 10*3/uL (ref 0.0–0.1)
Immature Granulocytes: 0 %
Lymphocytes Absolute: 2 10*3/uL (ref 0.7–3.1)
Lymphs: 36 %
MCH: 31.4 pg (ref 26.6–33.0)
MCHC: 35.8 g/dL — ABNORMAL HIGH (ref 31.5–35.7)
MCV: 88 fL (ref 79–97)
Monocytes Absolute: 0.7 10*3/uL (ref 0.1–0.9)
Monocytes: 13 %
Neutrophils Absolute: 2.7 10*3/uL (ref 1.4–7.0)
Neutrophils: 48 %
Platelets: 426 10*3/uL (ref 150–450)
RBC: 4.33 x10E6/uL (ref 3.77–5.28)
RDW: 13.6 % (ref 11.7–15.4)
WBC: 5.5 10*3/uL (ref 3.4–10.8)

## 2021-01-18 LAB — CMP14+EGFR
ALT: 15 IU/L (ref 0–32)
AST: 26 IU/L (ref 0–40)
Albumin/Globulin Ratio: 1.5 (ref 1.2–2.2)
Albumin: 4.6 g/dL (ref 3.8–4.8)
Alkaline Phosphatase: 78 IU/L (ref 44–121)
BUN/Creatinine Ratio: 15 (ref 9–23)
BUN: 9 mg/dL (ref 6–24)
Bilirubin Total: 0.3 mg/dL (ref 0.0–1.2)
CO2: 27 mmol/L (ref 20–29)
Calcium: 9.5 mg/dL (ref 8.7–10.2)
Chloride: 96 mmol/L (ref 96–106)
Creatinine, Ser: 0.62 mg/dL (ref 0.57–1.00)
Globulin, Total: 3 g/dL (ref 1.5–4.5)
Glucose: 75 mg/dL (ref 70–99)
Potassium: 3.5 mmol/L (ref 3.5–5.2)
Sodium: 138 mmol/L (ref 134–144)
Total Protein: 7.6 g/dL (ref 6.0–8.5)
eGFR: 108 mL/min/{1.73_m2} (ref >59)

## 2021-01-18 LAB — TSH: TSH: 0.93 u[IU]/mL (ref 0.450–4.500)

## 2021-01-21 NOTE — Progress Notes (Signed)
Pt states ("that is great news") understanding of labs

## 2021-03-24 ENCOUNTER — Other Ambulatory Visit: Payer: Self-pay | Admitting: Nurse Practitioner

## 2021-03-24 DIAGNOSIS — K219 Gastro-esophageal reflux disease without esophagitis: Secondary | ICD-10-CM

## 2021-03-24 DIAGNOSIS — R140 Abdominal distension (gaseous): Secondary | ICD-10-CM

## 2021-03-25 ENCOUNTER — Encounter: Payer: Self-pay | Admitting: Internal Medicine

## 2021-03-25 NOTE — Telephone Encounter (Signed)
I have refilled X 1 but needs visit for refills.

## 2021-05-27 DIAGNOSIS — G5603 Carpal tunnel syndrome, bilateral upper limbs: Secondary | ICD-10-CM | POA: Insufficient documentation

## 2021-05-28 ENCOUNTER — Ambulatory Visit: Payer: Commercial Managed Care - PPO | Admitting: Internal Medicine

## 2021-07-25 ENCOUNTER — Encounter: Payer: Self-pay | Admitting: Internal Medicine

## 2021-07-25 ENCOUNTER — Ambulatory Visit (INDEPENDENT_AMBULATORY_CARE_PROVIDER_SITE_OTHER): Payer: Commercial Managed Care - PPO | Admitting: Internal Medicine

## 2021-07-25 VITALS — BP 138/88 | HR 76 | Resp 18 | Ht 60.0 in | Wt 143.4 lb

## 2021-07-25 DIAGNOSIS — K219 Gastro-esophageal reflux disease without esophagitis: Secondary | ICD-10-CM

## 2021-07-25 DIAGNOSIS — I10 Essential (primary) hypertension: Secondary | ICD-10-CM

## 2021-07-25 DIAGNOSIS — F419 Anxiety disorder, unspecified: Secondary | ICD-10-CM

## 2021-07-25 DIAGNOSIS — R140 Abdominal distension (gaseous): Secondary | ICD-10-CM

## 2021-07-25 DIAGNOSIS — G5603 Carpal tunnel syndrome, bilateral upper limbs: Secondary | ICD-10-CM

## 2021-07-25 MED ORDER — AMLODIPINE BESYLATE 5 MG PO TABS: 5.0000 mg | ORAL_TABLET | Freq: Every day | ORAL | 1 refills | Status: DC

## 2021-07-25 MED ORDER — PROBIOTIC (LACTOBACILLUS) PO CAPS: 1.0000 | ORAL_CAPSULE | Freq: Every day | ORAL | 1 refills | Status: DC

## 2021-07-25 MED ORDER — HYDROCHLOROTHIAZIDE 12.5 MG PO CAPS: 12.5000 mg | ORAL_CAPSULE | Freq: Every day | ORAL | 1 refills | Status: DC

## 2021-07-25 NOTE — Progress Notes (Signed)
? ?Established Patient Office Visit ? ?Subjective:  ?Patient ID: Regina Castillo, female    DOB: [birth year]  Age: 51 y.o. MRN: [id number] ? ?CC:  ?Chief Complaint  ?Patient presents with  ? Follow-up  ?  6 month follow up pt stated headaches are not getting any better and she would like to discuss anxiety   ? ? ?HPI ?Regina Castillo is a 51 y.o. female with past medical history of hypertension, GERD, GAD and chronic neck pain who presents for f/u of her chronic medical conditions. ? ?HTN: BP is well-controlled. Takes medications regularly. Patient denies headache, dizziness, chest pain, dyspnea or palpitations. ? ?GAD: She complains of spells of anxiety, triggered by work related stress.  She has had insomnia and decreased concentration due to stress.  She has mild anhedonia as well.  Denies any SI or HI currently.  At at one point, she was thinking to retire early due to severe stress at work.  She has decided to continue working for now.  She denies to take any medicine for anxiety, but agrees for [facility] therapy for now. ? ?GERD: Well controlled with omeprazole.  She complains of intermittent bloating, for which she had tolerated probiotic well in the past.  Denies any diarrhea, melena or hematochezia currently. ? ?Carpal tunnel syndrome: She complains of bilateral wrist pain with radiating pain to hands.  She has been trying to use wrist brace for it.  She has seen Dr. Apolonio Schneiders at [facility], but does not want to follow-up for now.  She prefers to try noninvasive treatment for now. ? ? ?Past Medical History:  ?Diagnosis Date  ? Cyst of ovary, left   ? Fatty liver   ? Hypertension   ? Irritable bowel syndrome   ? Never fully evaluated for this.  ? ? ?Past Surgical History:  ?Procedure Laterality Date  ? ABDOMINAL HYSTERECTOMY    ? CESAREAN SECTION    ? ? ?Family History  ?Problem Relation Age of Onset  ? Diabetes Father   ? Diabetes Mother   ? Hypertension Mother   ? Colon cancer Neg Hx   ? ? ?Social History   ? ?Socioeconomic History  ? Marital status: Single  ?  Spouse name: Not on file  ? Number of children: 3  ? Years of education: Not on file  ? Highest education level: Not on file  ?Occupational History  ? Occupation: [employer]  ?Tobacco Use  ? Smoking status: Some Days  ?  Packs/day: 0.25  ?  Years: 15.00  ?  Pack years: 3.75  ?  Types: Cigarettes  ? Smokeless tobacco: Never  ?Vaping Use  ? Vaping Use: Never used  ?Substance and Sexual Activity  ? Alcohol use: Yes  ?  Comment: occasionally  ? Drug use: No  ? Sexual activity: Yes  ?  Birth control/protection: Surgical  ?  Comment: hyst  ?Other Topics Concern  ? Not on file  ?Social History Narrative  ? Works for [employer] Single, not dating. Live with mother. Has grown children, 3 boys.  Eats meat fruits and vegetables.  Wears seatbelt.  Currently lives with mother.  ? ?Social Determinants of Health  ? ?Financial Resource Strain: Low Risk   ? Difficulty of Paying Living Expenses: Not hard at all  ?Food Insecurity: No Food Insecurity  ? Worried About Charity fundraiser in the Last Year: Never true  ? Ran Out of Food in the Last Year: Never true  ?Transportation Needs: No Transportation  Needs  ? Lack of Transportation (Medical): No  ? Lack of Transportation (Non-Medical): No  ?Physical Activity: Insufficiently Active  ? Days of Exercise per Week: 1 day  ? Minutes of Exercise per Session: 10 min  ?Stress: No Stress Concern Present  ? Feeling of Stress : Not at all  ?Social Connections: Moderately Integrated  ? Frequency of Communication with Friends and Family: More than three times a week  ? Frequency of Social Gatherings with Friends and Family: Twice a week  ? Attends Religious Services: More than 4 times per year  ? Active Member of Clubs or Organizations: Yes  ? Attends Archivist Meetings: 1 to 4 times per year  ? Marital Status: Never married  ?Intimate Partner Violence: Not At Risk  ? Fear of Current or Ex-Partner: No  ? Emotionally Abused: No  ? Physically  Abused: No  ? Sexually Abused: No  ? ? ?Outpatient Medications Prior to Visit  ?Medication Sig Dispense Refill  ? Biotin w/ Vitamins C & E (HAIR/SKIN/NAILS PO) Take by mouth.    ? ELDERBERRY PO Take by mouth.    ? Fexofenadine HCl (ALLEGRA PO) Take by mouth.    ? fluticasone (FLONASE) 50 MCG/ACT nasal spray Place 2 sprays into both nostrils daily. 16 g 6  ? omeprazole (PRILOSEC) 40 MG capsule Take 1 capsule by mouth once daily 90 capsule 0  ? amLODipine (NORVASC) 5 MG tablet Take 1 tablet (5 mg total) by mouth daily. 90 tablet 1  ? hydrochlorothiazide (MICROZIDE) 12.5 MG capsule Take 1 capsule (12.5 mg total) by mouth daily. 90 capsule 1  ? ?No facility-administered medications prior to visit.  ? ? ?No Known Allergies ? ?ROS ?Review of Systems  ?Constitutional:  Negative for chills and fever.  ?HENT:  Negative for congestion, postnasal drip and sinus pressure.   ?Eyes:  Negative for pain and discharge.  ?Respiratory:  Negative for cough and shortness of breath.   ?Cardiovascular:  Negative for chest pain and palpitations.  ?Gastrointestinal:  Negative for abdominal pain, diarrhea, nausea and vomiting.  ?Endocrine: Negative for polydipsia and polyuria.  ?Genitourinary:  Negative for dysuria and hematuria.  ?Musculoskeletal:  Positive for back pain and neck pain. Negative for neck stiffness.  ?     B/l wrist pain  ?Skin:  Negative for rash.  ?Neurological:  Positive for headaches. Negative for dizziness, syncope, weakness and numbness.  ?Psychiatric/Behavioral:  Negative for agitation and behavioral problems.   ? ?  ?Objective:  ?  ?Physical Exam ?Vitals reviewed.  ?Constitutional:   ?   General: She is not in acute distress. ?   Appearance: She is not diaphoretic.  ?HENT:  ?   Head: Normocephalic and atraumatic.  ?   Right Ear: External ear normal. There is no impacted cerumen.  ?   Left Ear: External ear normal. There is no impacted cerumen.  ?   Nose: No congestion.  ?   Mouth/Throat:  ?   Mouth: Mucous membranes  are moist.  ?Eyes:  ?   General: No scleral icterus. ?   Extraocular Movements: Extraocular movements intact.  ?Cardiovascular:  ?   Rate and Rhythm: Normal rate and regular rhythm.  ?   Pulses: Normal pulses.  ?   Heart sounds: Normal heart sounds. No murmur heard. ?Pulmonary:  ?   Breath sounds: Normal breath sounds. No wheezing or rales.  ?Abdominal:  ?   Palpations: Abdomen is soft.  ?   Tenderness: There is no abdominal  tenderness.  ?Musculoskeletal:  ?   Cervical back: Neck supple. No tenderness.  ?   Right lower leg: No edema.  ?   Left lower leg: No edema.  ?Skin: ?   General: Skin is warm.  ?   Findings: No rash.  ?Neurological:  ?   General: No focal deficit present.  ?   Mental Status: She is alert and oriented to person, place, and time.  ?Psychiatric:     ?   Mood and Affect: Mood normal.     ?   Behavior: Behavior normal.  ? ? ?BP 138/88 (BP Location: Right Arm, Patient Position: Sitting, Cuff Size: Normal)   Pulse 76   Resp 18   Ht 5' (1.524 m)   Wt 143 lb 6.4 oz (65 kg)   SpO2 100%   BMI 28.01 kg/m?  ?Wt Readings from Last 3 Encounters:  ?07/25/21 143 lb 6.4 oz (65 kg)  ?01/17/21 140 lb 1.9 oz (63.6 kg)  ?12/24/20 140 lb (63.5 kg)  ? ? ?Lab Results  ?Component Value Date  ? TSH 0.930 01/17/2021  ? ?Lab Results  ?Component Value Date  ? WBC 5.5 01/17/2021  ? HGB 13.6 01/17/2021  ? HCT 38.0 01/17/2021  ? MCV 88 01/17/2021  ? PLT 426 01/17/2021  ? ?Lab Results  ?Component Value Date  ? NA 138 01/17/2021  ? K 3.5 01/17/2021  ? CO2 27 01/17/2021  ? GLUCOSE 75 01/17/2021  ? BUN 9 01/17/2021  ? CREATININE 0.62 01/17/2021  ? BILITOT 0.3 01/17/2021  ? ALKPHOS 78 01/17/2021  ? AST 26 01/17/2021  ? ALT 15 01/17/2021  ? PROT 7.6 01/17/2021  ? ALBUMIN 4.6 01/17/2021  ? CALCIUM 9.5 01/17/2021  ? EGFR 108 01/17/2021  ? ?Lab Results  ?Component Value Date  ? CHOL 206 (H) 01/17/2021  ? ?Lab Results  ?Component Value Date  ? HDL 97 01/17/2021  ? ?Lab Results  ?Component Value Date  ? [place] 89 01/17/2021   ? ?Lab Results  ?Component Value Date  ? TRIG 115 01/17/2021  ? ?Lab Results  ?Component Value Date  ? CHOLHDL 2.1 01/17/2021  ? ?No results found for: HGBA1C ? ?  ?Assessment & Plan:  ? ?Problem List Items Addressed This

## 2021-07-25 NOTE — Assessment & Plan Note (Signed)
Well controlled with omeprazole ?Restarted probiotic for bloating ?

## 2021-07-25 NOTE — Assessment & Plan Note (Signed)
BP Readings from Last 1 Encounters:  ?07/25/21 138/88  ? ?Usually well-controlled with Amlodipine 5 mg QD and HCTZ 12.5 mg QD ?Counseled for compliance with the medications ?Advised DASH diet and moderate exercise/walking, at least 150 mins/week ? ?

## 2021-07-25 NOTE — Patient Instructions (Addendum)
Please continue taking medications as prescribed. ? ?You are being referred to Behavioral health therapy for anxiety. ? ?Please continue to follow low salt diet and ambulate as tolerated. ?

## 2021-07-25 NOTE — Assessment & Plan Note (Signed)
Continue to wear wrist brace at nighttime ?Prefers to wait for orthopedic surgery referral for now ?

## 2021-07-25 NOTE — Assessment & Plan Note (Signed)
?    07/25/2021  ? 11:13 AM 12/24/2020  ?  2:38 PM 08/09/2019  ?  3:22 PM  ?GAD 7 : Generalized Anxiety Score  ?Nervous, Anxious, on Edge 1 0 0  ?Control/stop worrying 1 0 0  ?Worry too much - different things 0 0 0  ?Trouble relaxing 0 0 0  ?Restless 0 0 0  ?Easily annoyed or irritable 1 0 0  ?Afraid - awful might happen 0 0 0  ?Total GAD 7 Score 3 0 0  ?Anxiety Difficulty Somewhat difficult  Not difficult at all  ? ? ?Prefers to try relaxation techniques, material provided ?Prefers to avoid any medication ?Referred to [facility] therapy ?

## 2021-07-28 ENCOUNTER — Telehealth: Payer: Self-pay | Admitting: *Deleted

## 2021-07-28 NOTE — Chronic Care Management (AMB) (Signed)
?  Care Management  ? ?Note ? ?07/28/2021 ?Name: Regina Castillo MRN: [id number] DOB: 1970-11-18 ? ?Regina Castillo is a 51 y.o. year old female who is a primary care patient of Lindell Spar, MD. I reached out to Marva Panda by phone today offer care coordination services.  ? ?[name] was given information about care management services today including:  ?Care management services include personalized support from designated clinical staff supervised by her physician, including individualized plan of care and coordination with other care providers ?24/7 contact phone numbers for assistance for urgent and routine care needs. ?The patient may stop care management services at any time by phone call to the office staff. ? ?Patient agreed to services and verbal consent obtained.  ? ?Follow up plan: ?Telephone appointment with care management team member scheduled for:08/13/21 ? ?Laverda Sorenson  ?Care Guide, Embedded Care Coordination ?[place]  Care Management  ?Direct Dial: [phone] ? ?

## 2021-07-29 ENCOUNTER — Telehealth: Payer: Self-pay

## 2021-07-29 NOTE — Telephone Encounter (Signed)
FMLA   Copied Noted sleeved 

## 2021-08-05 DIAGNOSIS — Z0279 Encounter for issue of other medical certificate: Secondary | ICD-10-CM

## 2021-08-05 NOTE — Telephone Encounter (Signed)
Left voicemail forms were faxed and a copy at front desk for pick up ?

## 2021-08-13 ENCOUNTER — Ambulatory Visit: Payer: Commercial Managed Care - PPO | Admitting: Licensed Clinical Social Worker

## 2021-08-13 DIAGNOSIS — F419 Anxiety disorder, unspecified: Secondary | ICD-10-CM

## 2021-08-13 NOTE — Chronic Care Management (AMB) (Signed)
? ? ?  Social Work  ?Care Coordination Note ? ?08/13/2021 ?Name: Regina Castillo MRN: [id number] DOB: [birth year] ? ?Subjective: ?Regina Castillo is a 51 y.o. year old female who is a primary care patient of Anabel Halon, MD. The Care Management team was consulted to assist the patient with: Mental Health Counseling and Resources .   ? ?Consent to Services:  ?[name] was given information about Care Management services today including:  ?Care Management services includes personalized support from designated clinical staff supervised by her physician, including individualized plan of care and coordination with other care providers ?24/7 contact phone numbers for assistance for urgent and routine care needs. ?The patient may stop case management services at any time by phone call to the office staff. ? ?Patient agreed to services and consent obtained.  ?Engaged with Patient  by telephone for initial visit in response to referral for social work case management and/or care coordination services.  ? ?Summary: Assessed patient's current treatment, progress, coping skills, support system and barriers to care.  ?She is currently experiencing symptoms of  anxiety which seems to be exacerbated by not being able to manage stress at work..  ? ?Recommendation: Patient may benefit from, and is in agreement to implement interventions discussed today, and make calls to gather information to assist her with making informed decisions.  ? ?Follow up Plan: Patient would like continued follow-up from CCM LCSW.  per patient's request will follow up in 2 weeks.  Will call office if needed prior to next encounter. ?  ?SDOH (Social Determinants of Health) screening performed :  ?SDOH Interventions   ? ?Flowsheet Row Most Recent Value  ?SDOH Interventions   ?Stress Interventions Provide Counseling  ? ?  ?   ?LCSW Plan of care for Conditions / Unmet needs:  ?Mental Health:  (Status: New goal.) ?Evaluation of current treatment plan  related to Anxiety with Panic Symptoms, and Stress at work ?Depression screen reviewed  ?Solution-Focused Strategies employed:  ?Copywriter, advertising provided ?Active listening / Reflection utilized  ?Problem Solving /Task Center strategies reviewed ?Provided psychoeducation for mental health needs  ?Participation in counseling encouraged  ?Provided EMMI education information on Managing Anxiety Around Daily Task and Relieving Stress ? ?Task & Summary for AVS: ?Call [employer] to follow up on benefits offered by Employee Assistance ?Contact HR so that you know your numbers ( possible retirement options) ?Review your EMMI educational information (Managing Anxiety Around Daily Task and Relieving Stress) Look for an e-mail from [facility]. ?Contact your Insurance provider so that you know how much your co-pay is for therapy  ? ?Sammuel Hines, LCSW ?Licensed Clinical Social Worker [facility]  ?[facility] ?[phone]   ? ? ?

## 2021-08-13 NOTE — Patient Instructions (Signed)
Visit Information ? ?Thank you for taking time to visit with me today. Please don't hesitate to contact me if I can be of assistance to you before our next scheduled telephone appointment. ? ?Following are the goals we discussed today:  ?Task & Summary for AVS: ?Call [employer] to follow up on benefits offered by Employee Assistance ?Contact HR so that you know your numbers ( possible retirement options) ?Review your EMMI educational information (Managing Anxiety Around Daily Task and Relieving Stress) Look for an e-mail from [facility]. ?Contact your Insurance provider so that you know how much your co-pay is for therapy  ?  ?[name], LCSW ?Licensed Clinical Social Worker [facility]  ?[place] ?[phone]   ? ?Our next appointment is by telephone on May 24th at 11:30 ? ?Please call the care guide team at [phone] if you need to cancel or reschedule your appointment.  ? ?If you are experiencing a Mental Health or [place] or need someone to talk to, please call the [facility]: [phone]  ? ?Ms. Koen was given information about Care Management services by the embedded care coordination team including:  ?Care Management services include personalized support from designated clinical staff supervised by her physician, including individualized plan of care and coordination with other care providers ?24/7 contact phone numbers for assistance for urgent and routine care needs. ?The patient may stop CCM services at any time (effective at the end of the month) by phone call to the office staff. ? ?Patient agreed to services and verbal consent obtained.  ? ?Patient verbalizes understanding of instructions and care plan provided today and agrees to view in [place]. Active MyChart status confirmed with patient.   ? ? ? ?  ?

## 2021-08-27 ENCOUNTER — Ambulatory Visit: Payer: Commercial Managed Care - PPO | Admitting: Licensed Clinical Social Worker

## 2021-08-27 DIAGNOSIS — F439 Reaction to severe stress, unspecified: Secondary | ICD-10-CM

## 2021-08-27 DIAGNOSIS — F419 Anxiety disorder, unspecified: Secondary | ICD-10-CM

## 2021-08-27 NOTE — Chronic Care Management (AMB) (Signed)
Care Management Clinical Social Work Note  08/27/2021 Name: Regina Castillo MRN: [id number] DOB: 1971/01/25  Regina Castillo is a 51 y.o. year old female who is a primary care patient of Lindell Spar, MD.  The Care Management team was consulted for assistance with chronic disease management and coordination needs.  Engaged with patient by telephone for follow up visit in response to provider referral for social work chronic care management and care coordination services  Consent to Services:  Regina Castillo was given information about Care Management services today including:  Care Management services includes personalized support from designated clinical staff supervised by her physician, including individualized plan of care and coordination with other care providers 24/7 contact phone numbers for assistance for urgent and routine care needs. The patient may stop case management services at any time by phone call to the office staff.  Patient agreed to services and consent obtained.   Summary: Assessed patient's current treatment, progress, coping skills, support system and barriers to care.  Patient  continues to experience difficulty with with managing stress at work. .She made progress with exploring therapy options, and looking into possible early retirement.  She has decided not to move forward with either at this time.  See Care Plan below for interventions and patient self-care actives.  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   Follow up Plan: Patient would like continued follow-up from CCM LCSW.  per patient's request will follow up in 2 weeks.  Will call office if needed prior to next encounter.   Assessment: Review of patient past medical history, allergies, medications, and health status, including review of relevant consultants reports was performed today as  part of a comprehensive evaluation and provision of chronic care management and care coordination services.  SDOH (Social Determinants of Health) assessments and interventions performed:    Advanced Directives Status: Not addressed in this encounter.  Care Plan Conditions to be addressed/monitored: Anxiety and stress at work ;   Care Plan : LCSW Plan of Care  Updates made by Regina Cane, LCSW since 08/27/2021 12:00 AM     Problem: Coping Skills      Goal: Coping Skills Enhanced   Start Date: 08/13/2021  This Visit's Progress: On track  Priority: High  Note:   Current Barriers:  Disease Management support and education needs related to Stress at work and symptoms of anxiety  CSW Clinical Goal(s):  Patient  will explore community resource options for unmet needs related to:  Stress through collaboration with [place] representative, provider, and care team.   Interventions: Inter-disciplinary care team collaboration (see longitudinal plan of care) Evaluation of current treatment plan related to  self management and patient's adherence to plan as established by provider  Mental Health:  (Status: Goal on Track (progressing): YES.) Evaluation of current treatment plan related to  stress and symptoms of anxiety Depression screen reviewed  Solution-Focused Strategies employed:  Mindfulness or Relaxation training provided Active listening / Reflection utilized  Veterinary surgeon reviewed Provided psychoeducation for mental health needs  Provided EMMI education information on Relieving stress and Managing Anxiety Around Daily Task Discussed self-care action plan: sample copy e-mail  Task & activities to accomplish goals: Work on Insurance risk surveyor with [place] (sample copy e-mailed Review your EMMI educational information (Relieving Stress and Managing Anxiety Around Daily Task) Look for an e-mail from [facility]. Continue scheduled relaxed  breathing  3 times daily     Regina Lanius, LCSW Licensed Clinical Social Worker [facility]  [facility] [phone]

## 2021-08-27 NOTE — Patient Instructions (Signed)
Visit Information  Thank you for taking time to visit with me today. Please don't hesitate to contact me if I can be of assistance to you before our next scheduled telephone appointment.  Following are the goals we discussed today:  Task & activities to accomplish goals: Work on Mudlogger with [employer] (sample copy e-mailed Review your EMMI educational information (Relieving Stress and Managing Anxiety Around Daily Task) Look for an e-mail from [employer]. Continue scheduled relaxed breathing  3 times daily      Sammuel Hines, LCSW Licensed Clinical Social Worker [facility]  [facility] [phone]   Our next appointment is by telephone on June 7th at 11:30  Please call the care guide team at [phone] if you need to cancel or reschedule your appointment.   If you are experiencing a Mental Health or Behavioral Health Crisis or need someone to talk to, please call 1-800-273-TALK (toll free, 24 hour hotline) call the [facility]: [phone]   Patient verbalizes understanding of instructions and care plan provided today and agrees to view in MyChart. Active MyChart status and patient understanding of how to access instructions and care plan via MyChart confirmed with patient.

## 2021-09-09 ENCOUNTER — Telehealth: Payer: Self-pay | Admitting: Internal Medicine

## 2021-09-09 NOTE — Telephone Encounter (Signed)
Patient returning call.

## 2021-09-09 NOTE — Telephone Encounter (Signed)
Patient called and wants to talk with you about the billing of appointments with Debra Moore that you made.  I told her you did not make the appointment with Deborah Moore they Debra schedules them herself.  I did explain the referral comes from [facility] but we do not schedule or bill for these services. She said she just needs to talk with you.  

## 2021-09-09 NOTE — Telephone Encounter (Signed)
Patient called and wants to talk with you about the billing of appointments with [place] that you made.  I told her you did not make the appointment with [name] they Debra schedules them herself.  I did explain the referral comes from [facility] but we do not schedule or bill for these services. She said she just needs to talk with you.

## 2021-09-09 NOTE — Telephone Encounter (Signed)
LVM for pt to return to call

## 2021-09-09 NOTE — Telephone Encounter (Signed)
Spoke with patient she stated that Dr Allena Katz said something was of no charge to her and if she had known that there was a charge she would not have done this to start with. She is mad that she was billed outside of her appointment. She was under the assumption that this was free. I explained that billing is outside of our office. We refer out and after that its not in our control as far as what gets billed. She told me I was not understanding what she ways saying . She Stated that she wanted to know if she would be billed for her next visits like this one because if so she did not want to continue. I let her know again that I have no control over billing. She could reach out to billing to see what they could assist her with or I could let her speak back with management and she declined.  She would like to cancel upcoming appt with Regina Castillo. Can you please get this cancelled.

## 2021-09-10 ENCOUNTER — Telehealth: Payer: Self-pay | Admitting: *Deleted

## 2021-09-10 ENCOUNTER — Telehealth: Payer: Commercial Managed Care - PPO

## 2021-09-10 NOTE — Chronic Care Management (AMB) (Signed)
  Care Coordination Note  09/10/2021 Name: [name] MRN: [id number] DOB: 1970-07-03  [name] is a 51 y.o. year old female who is a primary care patient of Lindell Spar, MD and is actively engaged with the care management team. I reached out to Marva Panda by phone today to assist with scheduling a follow up visit with the Licensed Clinical Social Worker and billing concerns about prior Care Coordination call.    Follow up plan: Unsuccessful telephone outreach attempt made. A HIPAA compliant phone message was left for the patient providing contact information and requesting a return call.  The care management team will reach out to the patient again over the next 7 days.  If patient returns call to provider office, please advise to call [place] at [phone].  [place] [facility]  Direct Dial: [phone]

## 2021-09-10 NOTE — Chronic Care Management (AMB) (Signed)
  Care Coordination Note  09/10/2021 Name: Regina Castillo MRN: [id number] DOB: 20-Jan-1971  Regina Castillo is a 51 y.o. year old female who is a primary care patient of Lindell Spar, MD and is actively engaged with the care management team. I reached out to Marva Panda by phone today to assist with re-scheduling a follow up visit with the Licensed Clinical Social Worker  Follow up plan: Telephone appointment with care management team member scheduled for: 09/24/21  [place] [facility]  Direct Dial: [phone]

## 2021-09-24 ENCOUNTER — Ambulatory Visit: Payer: Commercial Managed Care - PPO | Admitting: Licensed Clinical Social Worker

## 2021-09-24 DIAGNOSIS — F419 Anxiety disorder, unspecified: Secondary | ICD-10-CM

## 2021-09-24 DIAGNOSIS — F439 Reaction to severe stress, unspecified: Secondary | ICD-10-CM

## 2021-09-24 NOTE — Chronic Care Management (AMB) (Signed)
Care Management Clinical Social Work Note  09/24/2021 Name: Regina Castillo MRN: [id number] DOB: [birth year]  Regina Castillo is a 51 y.o. year old female who is a primary care patient of Anabel Halon, MD.  The Care Management team was consulted for assistance with chronic disease management and coordination needs.  Engaged with patient by telephone for follow up visit in response to provider referral for social work chronic care management and care coordination services  Consent to Services:  Regina Castillo was given information about Care Management services today including:  Care Management services includes personalized support from designated clinical staff supervised by her physician, including individualized plan of care and coordination with other care providers 24/7 contact phone numbers for assistance for urgent and routine care needs. The patient may stop case management services at any time by phone call to the office staff.  Patient agreed to services and consent obtained.   Summary:  Patient is making progress with using coping skills discussed , but continues to have difficulty with managing emotions and re-focusing at work.  Noticed an increase in this once or twice a week with taking longer to calm down to allow her to complete her work. Will collaborate with PCP on her request for medication .  See Care Plan below for interventions and patient self-care actives.  Recommendation: Patient may benefit from, and is in agreement to contact PCP about options for anti anxiety medication as a 2nd defence to assist with managing her emotions when other coping skills and techniques will not work.   Follow up Plan: Patient would like continued follow-up from CCM LCSW.  per patient's request will follow up in 2 weeks.  Will call office if needed prior to next encounter.   Assessment: Review of patient past medical history, allergies, medications, and health status, including review of  relevant consultants reports was performed today as part of a comprehensive evaluation and provision of chronic care management and care coordination services.  SDOH (Social Determinants of Health) assessments and interventions performed:    Advanced Directives Status: Not addressed in this encounter.  Care Plan  Conditions to be addressed/monitored: Anxiety;  and stress  Care Plan : LCSW Plan of Care  Updates made by Soundra Pilon, LCSW since 09/24/2021 12:00 AM     Problem: Coping Skills      Goal: Coping Skills Enhanced   Start Date: 08/13/2021  This Visit's Progress: On track  Recent Progress: On track  Priority: High  Note:   Current Barriers:  Disease Management support and education needs related to Stress at work and symptoms of anxiety  CSW Clinical Goal(s):  Patient  will explore community resource options for unmet needs related to:  Stress through collaboration with Visual merchandiser, provider, and care team.   Interventions: Inter-disciplinary care team collaboration (see longitudinal plan of care) Evaluation of current treatment plan related to  self management and patient's adherence to plan as established by provider  Mental Health:  (Status: Goal on Track (progressing): YES.) Evaluation of current treatment plan related to  stress and symptoms of anxiety Solution-Focused Strategies employed:  Mindfulness or Relaxation training provided Active listening / Reflection utilized  Provided psychoeducation for mental health needs  Reviewed mental health medications and discussed importance of compliance: not currently on medication but interested in a low dose for anxiety Provided EMMI education information on Relieving stress and Managing Anxiety Around Daily Task Discussed self-care action plan: sample copy e-mail  Task & activities  to accomplish goals: Work on Mudlogger with [employer]  Review your EMMI educational information  (Relieving Stress and Managing Anxiety Around Daily Task) Look for an e-mail from [employer]. Continue scheduled relaxed breathing  3 times daily Reconnect with [employer] for ongoing therapy      Sammuel Hines, LCSW Licensed Clinical Social Worker [facility] [phone]

## 2021-09-24 NOTE — Patient Instructions (Signed)
Visit Information  Thank you for taking time to visit with me today. Please don't hesitate to contact me if I can be of assistance to you before our next scheduled telephone appointment.  Following are the goals we discussed today:  Task & activities to accomplish goals: Work on Mudlogger with [employer]  Review your EMMI educational information (Relieving Stress and Managing Anxiety Around Daily Task) Look for an e-mail from [employer]. Continue scheduled relaxed breathing  3 times daily Reconnect with [facility] for ongoing therapy  I have sent a message to Dr Allena Katz about your request for anti anxiety medication      Sammuel Hines, LCSW Licensed Clinical Social Worker [facility]  [facility] [phone]    Our last appointment is by telephone on July 5th at 12:00  Please call the care guide team at [phone] if you need to cancel or reschedule your appointment.   If you are experiencing a Mental Health or Behavioral Health Crisis or need someone to talk to, please call the [facility]: [phone]   Patient verbalizes understanding of instructions and care plan provided today and agrees to view in MyChart. Active MyChart status and patient understanding of how to access instructions and care plan via MyChart confirmed with patient.

## 2021-09-30 ENCOUNTER — Ambulatory Visit (INDEPENDENT_AMBULATORY_CARE_PROVIDER_SITE_OTHER): Payer: Commercial Managed Care - PPO | Admitting: Internal Medicine

## 2021-09-30 ENCOUNTER — Encounter: Payer: Self-pay | Admitting: Internal Medicine

## 2021-09-30 DIAGNOSIS — F419 Anxiety disorder, unspecified: Secondary | ICD-10-CM | POA: Diagnosis not present

## 2021-09-30 MED ORDER — CITALOPRAM HYDROBROMIDE 10 MG PO TABS: 10.0000 mg | ORAL_TABLET | Freq: Every day | ORAL | 2 refills | Status: DC

## 2021-09-30 NOTE — Progress Notes (Signed)
Virtual Visit via Telephone Note   This visit type was conducted due to national recommendations for restrictions regarding the COVID-19 Pandemic (e.g. social distancing) in an effort to limit this patient's exposure and mitigate transmission in our community.  Due to her co-morbid illnesses, this patient is at least at moderate risk for complications without adequate follow up.  This format is felt to be most appropriate for this patient at this time.  The patient did not have access to video technology/had technical difficulties with video requiring transitioning to audio format only (telephone).  All issues noted in this document were discussed and addressed.  No physical exam could be performed with this format.  Evaluation Performed:  Follow-up visit  Date:  09/30/2021   ID:  Regina Castillo, DOB [birth year], MRN [id number]  Patient Location: Home Provider Location: Office/Clinic  Participants: Patient Location of Patient: Home Location of Provider: Telehealth Consent was obtain for visit to be over via telehealth. I verified that I am speaking with the correct person using two identifiers.  PCP:  Anabel Halon, MD   Chief Complaint: Anxiety  History of Present Illness:    Regina Castillo is a 51 y.o. female who has a televisit for f/u of anxiety. She complains of spells of anxiety, triggered by work related stress.  She has had insomnia and decreased concentration due to stress.  She has mild anhedonia as well.  Denies any SI or HI currently. She has been getting BH therapy and is benefiting from it. She was hesitant to take any medicine for anxiety as she is worried about sedating effects of it, but is willing to take it now.  The patient does not have symptoms concerning for COVID-19 infection (fever, chills, cough, or new shortness of breath).   Past Medical, Surgical, Social History, Allergies, and Medications have been Reviewed.  Past Medical History:  Diagnosis  Date   Cyst of ovary, left    Fatty liver    Hypertension    Irritable bowel syndrome    Never fully evaluated for this.   Past Surgical History:  Procedure Laterality Date   ABDOMINAL HYSTERECTOMY     CESAREAN SECTION       Current Meds  Medication Sig   amLODipine (NORVASC) 5 MG tablet Take 1 tablet (5 mg total) by mouth daily.   Biotin w/ Vitamins C & E (HAIR/SKIN/NAILS PO) Take by mouth.   citalopram (CELEXA) 10 MG tablet Take 1 tablet (10 mg total) by mouth daily.   ELDERBERRY PO Take by mouth.   Fexofenadine HCl (ALLEGRA PO) Take by mouth.   fluticasone (FLONASE) 50 MCG/ACT nasal spray Place 2 sprays into both nostrils daily.   hydrochlorothiazide (MICROZIDE) 12.5 MG capsule Take 1 capsule (12.5 mg total) by mouth daily.   omeprazole (PRILOSEC) 40 MG capsule Take 1 capsule by mouth once daily   Probiotic, Lactobacillus, CAPS Take 1 capsule by mouth daily.     Allergies:   Patient has no known allergies.   ROS:   Please see the history of present illness.     All other systems reviewed and are negative.   Labs/Other Tests and Data Reviewed:    Recent Labs: 01/17/2021: ALT 15; BUN 9; Creatinine, Ser 0.62; Hemoglobin 13.6; Platelets 426; Potassium 3.5; Sodium 138; TSH 0.930   Recent Lipid Panel Lab Results  Component Value Date/Time   CHOL 206 (H) 01/17/2021 11:35 AM   TRIG 115 01/17/2021 11:35 AM   HDL 97  01/17/2021 11:35 AM   CHOLHDL 2.1 01/17/2021 11:35 AM   LDLCALC 89 01/17/2021 11:35 AM    Wt Readings from Last 3 Encounters:  07/25/21 143 lb 6.4 oz (65 kg)  01/17/21 140 lb 1.9 oz (63.6 kg)  12/24/20 140 lb (63.5 kg)     ASSESSMENT & PLAN:    Anxiety    09/30/2021   11:39 AM 07/25/2021   11:13 AM 12/24/2020    2:38 PM 08/09/2019    3:22 PM  GAD 7 : Generalized Anxiety Score  Nervous, Anxious, on Edge 3 1 0 0  Control/stop worrying 3 1 0 0  Worry too much - different things 3 0 0 0  Trouble relaxing 3 0 0 0  Restless 0 0 0 0  Easily annoyed or  irritable 3 1 0 0  Afraid - awful might happen 0 0 0 0  Total GAD 7 Score 15 3 0 0  Anxiety Difficulty Somewhat difficult Somewhat difficult  Not difficult at all   Started Celexa 10 mg QD, discussed about possible side effects Prefers to try relaxation techniques, material provided Continue BH therapy    Time:   Today, I have spent 16 minutes reviewing the chart, including problem list, medications, and with the patient with telehealth technology discussing the above problems.   Medication Adjustments/Labs and Tests Ordered: Current medicines are reviewed at length with the patient today.  Concerns regarding medicines are outlined above.   Tests Ordered: No orders of the defined types were placed in this encounter.   Medication Changes: Meds ordered this encounter  Medications   citalopram (CELEXA) 10 MG tablet    Sig: Take 1 tablet (10 mg total) by mouth daily.    Dispense:  30 tablet    Refill:  2     Note: This dictation was prepared with Dragon dictation along with smaller phrase technology. Similar sounding words can be transcribed inadequately or may not be corrected upon review. Any transcriptional errors that result from this process are unintentional.      Disposition:  Follow up  Signed, Anabel Halon, MD  09/30/2021 12:17 PM     [facility]

## 2021-10-08 ENCOUNTER — Encounter: Payer: Self-pay | Admitting: Licensed Clinical Social Worker

## 2021-10-08 ENCOUNTER — Ambulatory Visit: Payer: Commercial Managed Care - PPO | Admitting: Licensed Clinical Social Worker

## 2021-10-08 DIAGNOSIS — F419 Anxiety disorder, unspecified: Secondary | ICD-10-CM

## 2021-10-08 DIAGNOSIS — F439 Reaction to severe stress, unspecified: Secondary | ICD-10-CM

## 2021-10-08 NOTE — Chronic Care Management (AMB) (Signed)
Care Management Clinical Social Work Note  10/08/2021 Name: Regina Castillo MRN: [id number] DOB: Aug 17, 1970  Regina Castillo is a 51 y.o. year old female who is a primary care patient of Anabel Halon, MD.  The Care Management team was consulted for assistance with chronic disease management and coordination needs.  Engaged with patient by telephone for follow up visit in response to provider referral for social work chronic care management and care coordination services  Consent to Services:  Regina Castillo was given information about Care Management services today including:  Care Management services includes personalized support from designated clinical staff supervised by her physician, including individualized plan of care and coordination with other care providers 24/7 contact phone numbers for assistance for urgent and routine care needs. The patient may stop case management services at any time by phone call to the office staff.  Patient agreed to services and consent obtained.   Summary:  Patient is making progress with managing her anxiety. She has picked up Celexa but has not started taking it. Continues to connect with her faith with prayer and going to church .  See Care Plan below for interventions and patient self-care actives.  Recommendation: Patient may benefit from, and is in agreement to  Start taking Celexa as prescribe by PCP Call [facility] to connect for therapy or Explore options discussed today.   No Follow up Scheduled:  Patient has all needed information to complete care plan goals. I will disconnect from care team after this encounter. Patient has been informed to contact the office if new needs arise.   Assessment: Review of patient past medical history, allergies, medications, and health status, including review of relevant consultants reports was performed today as part of a comprehensive evaluation and provision of chronic care management and care  coordination services.  SDOH (Social Determinants of Health) assessments and interventions performed:    Advanced Directives Status: Not addressed in this encounter.  Care Plan Conditions to be addressed/monitored: Anxiety;   Care Plan : LCSW Plan of Care  Updates made by Soundra Pilon, LCSW since 10/08/2021 12:00 AM  Completed 10/08/2021   Problem: Coping Skills Resolved 10/08/2021     Goal: Coping Skills Enhanced Completed 10/08/2021  Start Date: 08/13/2021  This Visit's Progress: On track  Recent Progress: On track  Priority: High  Note:   Current Barriers:  Disease Management support and education needs related to Stress at work and symptoms of anxiety  CSW Clinical Goal(s):  Patient  will explore community resource options for unmet needs related to:  Stress through collaboration with Visual merchandiser, provider, and care team.   Interventions: Inter-disciplinary care team collaboration (see longitudinal plan of care) Evaluation of current treatment plan related to  self management and patient's adherence to plan as established by provider  Mental Health:  (Status: Goal on Track (progressing): YES.) Evaluation of current treatment plan related to  stress and symptoms of anxiety Solution-Focused Strategies employed:  Mindfulness or Relaxation training provided Active listening / Reflection utilized  Provided psychoeducation for mental health needs  Reviewed mental health medications and discussed importance of compliance: picked up medication but has not started taking it Provided EMMI education information on Relieving stress and Managing Anxiety Around Daily Task Discussed self-care action plan: sample copy e-mail  Task & activities to accomplish goals: Work on Mudlogger with scheduling time for yourself  Review your EMMI educational information (Relieving Stress and Managing Anxiety Around Daily Task) Look for  an e-mail from [facility]. Continue scheduled relaxed breathing  3 times daily Start taking medication as prescribed by Dr. Allena Katz Call and reconnect with [facility] for ongoing therapy   [facility] Locations in [place] and [place] Regina Castillo   [email or web address]  [phone]   can text her at  this number   [phone] Email: [email or web address]        Sammuel Hines, LCSW Licensed Clinical Social Worker [facility]  [facility] [phone]

## 2021-10-08 NOTE — Patient Instructions (Signed)
Visit Information  Congratulations on achieving your goals! It was a pleasure working with you, and I hope you continue to make great strides in improving your health. No Follow up Scheduled:  You do not require continued follow-up by care coordination team Per our conversation, I will disconnect from your care team at this time Please contact the office if needed  Following are the goals we discussed today:  Task & activities to accomplish goals: Work on Mudlogger with scheduling time for yourself  Review your EMMI educational information (Relieving Stress and Managing Anxiety Around Daily Task) Look for an e-mail from [employer]. Continue scheduled relaxed breathing  3 times daily Start taking medication as prescribed by Dr. Allena Katz Call and reconnect with [facility] for ongoing therapy   [facility] Locations in [place] and [place]   [email or web address]  [phone]   can text her at  this number   [phone] Email: [email or web address]       Sammuel Hines, LCSW Licensed Clinical Social Worker [facility]  [facility] [phone]   If you are experiencing a Mental Health or Behavioral Health Crisis or need someone to talk to, please call the [facility]: [phone]   The patient verbalized understanding of instructions, educational materials, and care plan provided today and agreed to receive a mailed copy of patient instructions, educational materials, and care plan.

## 2021-10-15 ENCOUNTER — Ambulatory Visit
Admission: EM | Admit: 2021-10-15 | Discharge: 2021-10-15 | Disposition: A | Discharge status: Home / Self Care | Payer: Commercial Managed Care - PPO | Attending: Nurse Practitioner | Admitting: Nurse Practitioner

## 2021-10-15 ENCOUNTER — Other Ambulatory Visit: Payer: Self-pay

## 2021-10-15 ENCOUNTER — Ambulatory Visit (INDEPENDENT_AMBULATORY_CARE_PROVIDER_SITE_OTHER): Payer: Commercial Managed Care - PPO

## 2021-10-15 ENCOUNTER — Encounter: Payer: Self-pay | Admitting: Emergency Medicine

## 2021-10-15 DIAGNOSIS — R109 Unspecified abdominal pain: Secondary | ICD-10-CM | POA: Diagnosis not present

## 2021-10-15 LAB — POCT URINALYSIS DIP (MANUAL ENTRY)
Bilirubin, UA: NEGATIVE
Glucose, UA: NEGATIVE mg/dL
Ketones, POC UA: NEGATIVE mg/dL
Leukocytes, UA: NEGATIVE
Nitrite, UA: NEGATIVE
Protein Ur, POC: 30 mg/dL — AB
Spec Grav, UA: 1.025 (ref 1.010–1.025)
Urobilinogen, UA: 0.2 E.U./dL
pH, UA: 7 (ref 5.0–8.0)

## 2021-10-15 MED ORDER — ONDANSETRON HCL 4 MG PO TABS: 4.0000 mg | ORAL_TABLET | Freq: Three times a day (TID) | ORAL | 0 refills | Status: DC | PRN

## 2021-10-15 NOTE — ED Provider Notes (Signed)
[facility]    CSN: [id number] Arrival date & time: 10/15/21  0959      History   Chief Complaint Chief Complaint  Patient presents with   Abdominal Pain    HPI Regina Castillo is a 51 y.o. female.   The history is provided by the patient.   Patient is a 51 year old female who presents for abdominal pain.  Symptoms started this morning around 4 AM, waking her from her sleep.  Pain is located in her lower abdomen.  She states that she has vomited approximately 4 times since her symptoms started.  Denies fever, chills, urinary symptoms, low back pain, diarrhea.  She does have a history of left ovarian cyst, fatty liver, GERD and irritable bowel is on her chart.  She states that she has never been "diagnosed with irritable bowel".  She does take medication for her reflux, but states she has not taken it today.  States that when she has gas, she also experiences the same or similar symptoms.  States that she has not had anything to eat or drink to take Gas-X or ibuprofen today.  She states since the onset of her symptoms, her abdominal pain has improved.  Past Medical History:  Diagnosis Date   Cyst of ovary, left    Fatty liver    Hypertension    Irritable bowel syndrome    Never fully evaluated for this.    Patient Active Problem List   Diagnosis Date Noted   Bilateral carpal tunnel syndrome 05/27/2021   Seasonal allergic rhinitis 01/17/2021   Idiopathic peripheral neuropathy 11/25/2020   Anxiety 07/12/2020   Body mass index (BMI) 27.0-27.9, adult 07/05/2020   DDD (degenerative disc disease), cervical 03/15/2020   Dizziness 03/15/2020   Primary hypertension 03/15/2020   Screening for colorectal cancer 08/09/2019   GERD (gastroesophageal reflux disease) 09/13/2017   Bloating 09/13/2017   Fatty liver    Irritable bowel syndrome    Low vitamin D level 03/10/2016    Past Surgical History:  Procedure Laterality Date   ABDOMINAL HYSTERECTOMY     CESAREAN  SECTION      OB History     Gravida  3   Para  3   Term  3   Preterm      AB      Living  3      SAB      IAB      Ectopic      Multiple      Live Births  3            Home Medications    Prior to Admission medications   Medication Sig Start Date End Date Taking? Authorizing Provider  ondansetron (ZOFRAN) 4 MG tablet Take 1 tablet (4 mg total) by mouth every 8 (eight) hours as needed for nausea or vomiting. 10/15/21  Yes Mollye Guinta-Warren, Sadie Haber, NP  amLODipine (NORVASC) 5 MG tablet Take 1 tablet (5 mg total) by mouth daily. 07/25/21   Anabel Halon, MD  Biotin w/ Vitamins C & E (HAIR/SKIN/NAILS PO) Take by mouth.    [provider]  citalopram (CELEXA) 10 MG tablet Take 1 tablet (10 mg total) by mouth daily. 09/30/21   Anabel Halon, MD  ELDERBERRY PO Take by mouth.    [provider]  Fexofenadine HCl (ALLEGRA PO) Take by mouth.    [provider]  fluticasone (FLONASE) 50 MCG/ACT nasal spray Place 2 sprays into both nostrils daily.  01/17/21   Anabel Halon, MD  hydrochlorothiazide (MICROZIDE) 12.5 MG capsule Take 1 capsule (12.5 mg total) by mouth daily. 07/25/21   Anabel Halon, MD  omeprazole (PRILOSEC) 40 MG capsule Take 1 capsule by mouth once daily 03/25/21   Gelene Mink, NP  Probiotic, Lactobacillus, CAPS Take 1 capsule by mouth daily. 07/25/21   Anabel Halon, MD    Family History Family History  Problem Relation Age of Onset   Diabetes Father    Diabetes Mother    Hypertension Mother    Colon cancer Neg Hx     Social History Social History   Tobacco Use   Smoking status: Some Days    Packs/day: 0.25    Years: 15.00    Total pack years: 3.75    Types: Cigarettes   Smokeless tobacco: Never  Vaping Use   Vaping Use: Never used  Substance Use Topics   Alcohol use: Yes    Comment: occasionally   Drug use: No     Allergies   Patient has no known allergies.   Review of Systems Review of  Systems Per HPI  Physical Exam Triage Vital Signs ED Triage Vitals [10/15/21 1010]  Enc Vitals Group     BP 129/82     Pulse Rate 82     Resp 20     Temp 97.9 F (36.6 C)     Temp Source Oral     SpO2 98 %     Weight      Height      Head Circumference      Peak Flow      Pain Score 9     Pain Loc      Pain Edu?      Excl. in GC?    No data found.  Updated Vital Signs BP 129/82 (BP Location: Right Arm)   Pulse 82   Temp 97.9 F (36.6 C) (Oral)   Resp 20   SpO2 98%   Visual Acuity Right Eye Distance:   Left Eye Distance:   Bilateral Distance:    Right Eye Near:   Left Eye Near:    Bilateral Near:     Physical Exam Vitals and nursing note reviewed.  Constitutional:      General: She is not in acute distress.    Appearance: She is well-developed.  HENT:     Head: Normocephalic.  Eyes:     Extraocular Movements: Extraocular movements intact.     Pupils: Pupils are equal, round, and reactive to light.  Cardiovascular:     Rate and Rhythm: Normal rate and regular rhythm.     Heart sounds: Normal heart sounds.  Pulmonary:     Effort: Pulmonary effort is normal.     Breath sounds: Normal breath sounds.  Abdominal:     General: Bowel sounds are normal. There is no distension.     Tenderness: There is abdominal tenderness in the right lower quadrant and periumbilical area. There is no right CVA tenderness.  Skin:    General: Skin is warm and dry.  Neurological:     General: No focal deficit present.     Mental Status: She is alert and oriented to person, place, and time.  Psychiatric:        Mood and Affect: Mood normal.        Behavior: Behavior normal.      UC Treatments / Results  Labs (all labs ordered are listed, but only  abnormal results are displayed) Labs Reviewed  POCT URINALYSIS DIP (MANUAL ENTRY) - Abnormal; Notable for the following components:      Result Value   Blood, UA small (*)    Protein Ur, POC =30 (*)    All other components  within normal limits  URINE CULTURE    EKG   Radiology DG Abd 1 View  Result Date: 10/15/2021 CLINICAL DATA:  Abdominal pain EXAM: ABDOMEN - 1 VIEW COMPARISON:  None Available. FINDINGS: The bowel gas pattern is normal. No radio-opaque calculi. Mild degenerative changes of the bilateral hips and pubic symphysis. IMPRESSION: Nonobstructive bowel-gas pattern. Electronically Signed   By: Allegra Lai M.D.   On: 10/15/2021 10:57    Procedures Procedures (including critical care time)  Medications Ordered in UC Medications - No data to display  Initial Impression / Assessment and Plan / UC Course  I have reviewed the triage vital signs and the nursing notes.  Pertinent labs & imaging results that were available during my care of the patient were reviewed by me and considered in my medical decision making (see chart for details).  Patient presents for complaints of abdominal pain that started this morning.  On exam, her vital signs are stable, she is in no acute distress.  She does exhibit periumbilical tenderness and right lower quadrant tenderness.  Patient states symptoms have improved.  There is no concern for acute abdomen at this time as patient is afebrile and she is in no acute distress.  She continues to report improvement of her symptoms.  Urinalysis is negative for urinary tract infection, urine culture is pending.  Abdominal x-ray is negative.  Differential diagnoses include constipation, diverticulitis, or appendicitis.  Supportive care recommendations were provided to the patient with strict indications of when to go to the emergency department.  Patient advised to follow-up with her primary care physician if her symptoms do not improve. Final Clinical Impressions(s) / UC Diagnoses   Final diagnoses:  Abdominal pain, unspecified abdominal location     Discharge Instructions      Your urinalysis and abdominal x-ray were normal. Recommend over-the-counter Tylenol to help  with abdominal pain or discomfort. Recommend a bland diet to include bananas, rice, applesauce, toast, yogurt, pudding, Jell-O until symptoms improve. If your symptoms continue to persist, please go to the emergency department immediately for further evaluation. Follow-up with your primary care physician or gastroenterology if your symptoms do not improve.     ED Prescriptions     Medication Sig Dispense Auth. Provider   ondansetron (ZOFRAN) 4 MG tablet Take 1 tablet (4 mg total) by mouth every 8 (eight) hours as needed for nausea or vomiting. 20 tablet Giovanny Dugal-Warren, Sadie Haber, NP      PDMP not reviewed this encounter.   Abran Cantor, NP 10/15/21 1128

## 2021-10-15 NOTE — Discharge Instructions (Addendum)
Your urinalysis and abdominal x-ray were normal. Recommend over-the-counter Tylenol to help with abdominal pain or discomfort. Recommend a bland diet to include bananas, rice, applesauce, toast, yogurt, pudding, Jell-O until symptoms improve. If your symptoms continue to persist, please go to the emergency department immediately for further evaluation. Follow-up with your primary care physician or gastroenterology if your symptoms do not improve.

## 2021-10-15 NOTE — ED Triage Notes (Signed)
Pt reports periumbilical pain that started at 5am this am. Pt reports nasuea,several BM's this am but denies diarrhea.

## 2021-10-20 ENCOUNTER — Telehealth: Payer: Self-pay | Admitting: Internal Medicine

## 2021-10-20 NOTE — Telephone Encounter (Signed)
Patient currently has FMLA filled out she has intermediate FMLA she is wanting to know if this can be filled out for a medical leave from 7-17 through 8-1 for a short term leave please advise also advised pt we will need new FMLA forms

## 2021-10-20 NOTE — Telephone Encounter (Signed)
Patient advised to have her job send [place] the [facility] forms

## 2021-10-20 NOTE — Telephone Encounter (Signed)
Pt called states she is needing an adjustment on the FMLA that was completed. She is wanting to know if you can please give her a call?

## 2021-10-21 NOTE — Telephone Encounter (Signed)
FMLA forms 10/20/2021 - 11/04/2021  Copied Noted sleeved

## 2021-10-27 ENCOUNTER — Telehealth: Payer: Self-pay | Admitting: Internal Medicine

## 2021-10-27 DIAGNOSIS — Z0279 Encounter for issue of other medical certificate: Secondary | ICD-10-CM

## 2021-10-27 NOTE — Telephone Encounter (Signed)
Pt called stating her job contacted her today stating they have not received the FMLA pwk that was completed last week. She is wanting to know the status on this? Pt asked for nurse to give her a call.

## 2021-10-27 NOTE — Telephone Encounter (Addendum)
Patient notified fmla forms were complete and ready for pick up.  Faxed copy to patient HR

## 2021-10-27 NOTE — Telephone Encounter (Signed)
Please see if this was completed there should be documentation with the forms it could be processing for charges and remind her that we have 10 business days to complete forms

## 2021-10-31 ENCOUNTER — Other Ambulatory Visit: Payer: Self-pay | Admitting: Gastroenterology

## 2021-10-31 DIAGNOSIS — K219 Gastro-esophageal reflux disease without esophagitis: Secondary | ICD-10-CM

## 2021-10-31 DIAGNOSIS — R140 Abdominal distension (gaseous): Secondary | ICD-10-CM

## 2021-11-05 NOTE — Telephone Encounter (Signed)
Needs office visit for further refills. Can be virtual with Baxter Hire or Toni Amend

## 2021-11-14 ENCOUNTER — Telehealth: Payer: Commercial Managed Care - PPO | Admitting: Gastroenterology

## 2021-11-17 ENCOUNTER — Other Ambulatory Visit: Payer: Self-pay | Admitting: Internal Medicine

## 2021-11-17 DIAGNOSIS — Z1231 Encounter for screening mammogram for malignant neoplasm of breast: Secondary | ICD-10-CM

## 2021-12-02 ENCOUNTER — Encounter: Payer: Self-pay | Admitting: Internal Medicine

## 2021-12-02 ENCOUNTER — Ambulatory Visit (INDEPENDENT_AMBULATORY_CARE_PROVIDER_SITE_OTHER): Payer: Commercial Managed Care - PPO | Admitting: Internal Medicine

## 2021-12-02 DIAGNOSIS — F419 Anxiety disorder, unspecified: Secondary | ICD-10-CM

## 2021-12-02 NOTE — Progress Notes (Signed)
Virtual Visit via Telephone Note   This visit type was conducted due to national recommendations for restrictions regarding the COVID-19 Pandemic (e.g. social distancing) in an effort to limit this patient's exposure and mitigate transmission in our community.  Due to her co-morbid illnesses, this patient is at least at moderate risk for complications without adequate follow up.  This format is felt to be most appropriate for this patient at this time.  The patient did not have access to video technology/had technical difficulties with video requiring transitioning to audio format only (telephone).  All issues noted in this document were discussed and addressed.  No physical exam could be performed with this format.  Evaluation Performed:  Follow-up visit  Date:  12/02/2021   ID:  Regina Castillo, DOB 1970-10-10, MRN [id number]  Patient Location: Home Provider Location: Office/Clinic  Participants: Patient Location of Patient: Home Location of Provider: Telehealth Consent was obtain for visit to be over via telehealth. I verified that I am speaking with the correct person using two identifiers.  PCP:  Anabel Halon, MD   Chief Complaint: Follow-up of anxiety  History of Present Illness:    Regina Castillo is a 51 y.o. female who has a televisit for follow-up of her anxiety.  She was placed on Celexa, but she preferred to manage her anxiety with relaxation techniques alone.  She was able to take 2 weeks off from her work, and felt better after it.  She has returned to work for the last 4 weeks now.  She denies any panic episode recently.  Denies any anhedonia, SI or HI currently.  The patient does not have symptoms concerning for COVID-19 infection (fever, chills, cough, or new shortness of breath).   Past Medical, Surgical, Social History, Allergies, and Medications have been Reviewed.  Past Medical History:  Diagnosis Date   Cyst of ovary, left    Fatty liver     Hypertension    Irritable bowel syndrome    Never fully evaluated for this.   Past Surgical History:  Procedure Laterality Date   ABDOMINAL HYSTERECTOMY     CESAREAN SECTION       Current Meds  Medication Sig   amLODipine (NORVASC) 5 MG tablet Take 1 tablet (5 mg total) by mouth daily.   Biotin w/ Vitamins C & E (HAIR/SKIN/NAILS PO) Take by mouth.   citalopram (CELEXA) 10 MG tablet Take 1 tablet (10 mg total) by mouth daily.   ELDERBERRY PO Take by mouth.   Fexofenadine HCl (ALLEGRA PO) Take by mouth.   fluticasone (FLONASE) 50 MCG/ACT nasal spray Place 2 sprays into both nostrils daily.   hydrochlorothiazide (MICROZIDE) 12.5 MG capsule Take 1 capsule (12.5 mg total) by mouth daily.   omeprazole (PRILOSEC) 40 MG capsule Take 1 capsule by mouth once daily   ondansetron (ZOFRAN) 4 MG tablet Take 1 tablet (4 mg total) by mouth every 8 (eight) hours as needed for nausea or vomiting.   Probiotic, Lactobacillus, CAPS Take 1 capsule by mouth daily.     Allergies:   Patient has no known allergies.   ROS:   Please see the history of present illness.     All other systems reviewed and are negative.   Labs/Other Tests and Data Reviewed:    Recent Labs: 01/17/2021: ALT 15; BUN 9; Creatinine, Ser 0.62; Hemoglobin 13.6; Platelets 426; Potassium 3.5; Sodium 138; TSH 0.930   Recent Lipid Panel Lab Results  Component Value Date/Time  CHOL 206 (H) 01/17/2021 11:35 AM   TRIG 115 01/17/2021 11:35 AM   HDL 97 01/17/2021 11:35 AM   CHOLHDL 2.1 01/17/2021 11:35 AM   LDLCALC 89 01/17/2021 11:35 AM    Wt Readings from Last 3 Encounters:  07/25/21 143 lb 6.4 oz (65 kg)  01/17/21 140 lb 1.9 oz (63.6 kg)  12/24/20 140 lb (63.5 kg)     ASSESSMENT & PLAN:    Anxiety    12/02/2021   11:24 AM 09/30/2021   11:39 AM 07/25/2021   11:13 AM 12/24/2020    2:38 PM  GAD 7 : Generalized Anxiety Score  Nervous, Anxious, on Edge 0 3 1 0  Control/stop worrying 0 3 1 0  Worry too much - different  things 0 3 0 0  Trouble relaxing 0 3 0 0  Restless 0 0 0 0  Easily annoyed or irritable 0 3 1 0  Afraid - awful might happen 0 0 0 0  Total GAD 7 Score 0 15 3 0  Anxiety Difficulty Not difficult at all Somewhat difficult Somewhat difficult    Did not continue Celexa, discontinued Prefers to try relaxation techniques, material provided Continue BH therapy   Time:   Today, I have spent 9 minutes reviewing the chart, including problem list, medications, and with the patient with telehealth technology discussing the above problems.   Medication Adjustments/Labs and Tests Ordered: Current medicines are reviewed at length with the patient today.  Concerns regarding medicines are outlined above.   Tests Ordered: No orders of the defined types were placed in this encounter.   Medication Changes: No orders of the defined types were placed in this encounter.    Note: This dictation was prepared with Dragon dictation along with smaller phrase technology. Similar sounding words can be transcribed inadequately or may not be corrected upon review. Any transcriptional errors that result from this process are unintentional.      Disposition:  Follow up  Signed, Anabel Halon, MD  12/02/2021 11:39 AM     [facility]

## 2021-12-02 NOTE — Assessment & Plan Note (Signed)
    12/02/2021   11:24 AM 09/30/2021   11:39 AM 07/25/2021   11:13 AM 12/24/2020    2:38 PM  GAD 7 : Generalized Anxiety Score  Nervous, Anxious, on Edge 0 3 1 0  Control/stop worrying 0 3 1 0  Worry too much - different things 0 3 0 0  Trouble relaxing 0 3 0 0  Restless 0 0 0 0  Easily annoyed or irritable 0 3 1 0  Afraid - awful might happen 0 0 0 0  Total GAD 7 Score 0 15 3 0  Anxiety Difficulty Not difficult at all Somewhat difficult Somewhat difficult    Did not continue Celexa, discontinued Prefers to try relaxation techniques, material provided Continue [facility] therapy

## 2021-12-22 ENCOUNTER — Ambulatory Visit
Admission: RE | Admit: 2021-12-22 | Discharge: 2021-12-22 | Disposition: A | Discharge status: Home / Self Care | Payer: Commercial Managed Care - PPO | Source: Ambulatory Visit | Attending: Internal Medicine | Admitting: Internal Medicine

## 2021-12-22 DIAGNOSIS — Z1231 Encounter for screening mammogram for malignant neoplasm of breast: Secondary | ICD-10-CM

## 2021-12-30 ENCOUNTER — Ambulatory Visit (INDEPENDENT_AMBULATORY_CARE_PROVIDER_SITE_OTHER): Payer: Commercial Managed Care - PPO | Admitting: Obstetrics & Gynecology

## 2021-12-30 ENCOUNTER — Encounter: Payer: Self-pay | Admitting: Obstetrics & Gynecology

## 2021-12-30 VITALS — BP 148/98 | HR 75 | Ht 60.0 in | Wt 146.0 lb

## 2021-12-30 DIAGNOSIS — B9689 Other specified bacterial agents as the cause of diseases classified elsewhere: Secondary | ICD-10-CM

## 2021-12-30 DIAGNOSIS — Z01419 Encounter for gynecological examination (general) (routine) without abnormal findings: Secondary | ICD-10-CM | POA: Diagnosis not present

## 2021-12-30 DIAGNOSIS — N760 Acute vaginitis: Secondary | ICD-10-CM | POA: Diagnosis not present

## 2021-12-30 MED ORDER — NUVESSA 1.3 % VA GEL: 1.0000 | Freq: Once | VAGINAL | 1 refills | Status: AC

## 2021-12-30 NOTE — Progress Notes (Signed)
Subjective:     Regina Castillo is a 51 y.o. female here for a routine exam.  No LMP recorded. Patient has had a hysterectomy. [id number] Birth Control Method:  hysterectomy Menstrual Calendar(currently): n/a  Current complaints: some discharge.   Current acute medical issues:  HTN   Recent Gynecologic History No LMP recorded. Patient has had a hysterectomy. Last Pap: na,   Last mammogram: 9/23,  normal  Past Medical History:  Diagnosis Date   Cyst of ovary, left    Fatty liver    Hypertension    Irritable bowel syndrome    Never fully evaluated for this.    Past Surgical History:  Procedure Laterality Date   ABDOMINAL HYSTERECTOMY     CESAREAN SECTION      OB History     Gravida  3   Para  3   Term  3   Preterm      AB      Living  3      SAB      IAB      Ectopic      Multiple      Live Births  3           Social History   Socioeconomic History   Marital status: Single    Spouse name: Not on file   Number of children: 3   Years of education: Not on file   Highest education level: Not on file  Occupational History   Occupation: [employer]  Tobacco Use   Smoking status: Some Days    Packs/day: 0.25    Years: 15.00    Total pack years: 3.75    Types: Cigarettes   Smokeless tobacco: Never  Vaping Use   Vaping Use: Never used  Substance and Sexual Activity   Alcohol use: Yes    Comment: occasionally   Drug use: No   Sexual activity: Yes    Birth control/protection: Surgical    Comment: hyst  Other Topics Concern   Not on file  Social History Narrative   Works for [employer] Single, not dating. Live with mother. Has grown children, 3 boys.  Eats meat fruits and vegetables.  Wears seatbelt.  Currently lives with mother.   Social Determinants of Health   Financial Resource Strain: Low Risk  (08/13/2021)   Overall Financial Resource Strain (CARDIA)    Difficulty of Paying Living Expenses: Not hard at all  Food Insecurity: No Food Insecurity  (12/30/2021)   Hunger Vital Sign    Worried About Running Out of Food in the Last Year: Never true    Ran Out of Food in the Last Year: Never true  Transportation Needs: No Transportation Needs (12/30/2021)   PRAPARE - Hydrologist (Medical): No    Lack of Transportation (Non-Medical): No  Physical Activity: Inactive (12/30/2021)   Exercise Vital Sign    Days of Exercise per Week: 0 days    Minutes of Exercise per Session: 0 min  Stress: Stress Concern Present (12/30/2021)   [place]    Feeling of Stress : To some extent  Social Connections: Moderately Isolated (12/30/2021)   Social Connection and Isolation Panel [NHANES]    Frequency of Communication with Friends and Family: More than three times a week    Frequency of Social Gatherings with Friends and Family: Once a week    Attends Religious Services: More than 4 times per year  Active Member of Clubs or Organizations: No    Attends Archivist Meetings: Never    Marital Status: Never married    Family History  Problem Relation Age of Onset   Diabetes Father    Diabetes Mother    Hypertension Mother    Colon cancer Neg Hx      Current Outpatient Medications:    amLODipine (NORVASC) 5 MG tablet, Take 1 tablet (5 mg total) by mouth daily., Disp: 90 tablet, Rfl: 1   Biotin w/ Vitamins C & E (HAIR/SKIN/NAILS PO), Take by mouth., Disp: , Rfl:    ELDERBERRY PO, Take by mouth., Disp: , Rfl:    Fexofenadine HCl (ALLEGRA PO), Take by mouth., Disp: , Rfl:    hydrochlorothiazide (MICROZIDE) 12.5 MG capsule, Take 1 capsule (12.5 mg total) by mouth daily., Disp: 90 capsule, Rfl: 1   NUVESSA 1.3 % GEL, Place 1 applicator vaginally once for 1 dose., Disp: 5 g, Rfl: 1   omeprazole (PRILOSEC) 40 MG capsule, Take 1 capsule by mouth once daily, Disp: 90 capsule, Rfl: 0   ondansetron (ZOFRAN) 4 MG tablet, Take 1 tablet (4 mg total) by mouth  every 8 (eight) hours as needed for nausea or vomiting. (Patient not taking: Reported on 12/30/2021), Disp: 20 tablet, Rfl: 0   Probiotic, Lactobacillus, CAPS, Take 1 capsule by mouth daily. (Patient not taking: Reported on 12/30/2021), Disp: 90 capsule, Rfl: 1  Review of Systems  Review of Systems  Constitutional: Negative for fever, chills, weight loss, malaise/fatigue and diaphoresis.  HENT: Negative for hearing loss, ear pain, nosebleeds, congestion, sore throat, neck pain, tinnitus and ear discharge.   Eyes: Negative for blurred vision, double vision, photophobia, pain, discharge and redness.  Respiratory: Negative for cough, hemoptysis, sputum production, shortness of breath, wheezing and stridor.   Cardiovascular: Negative for chest pain, palpitations, orthopnea, claudication, leg swelling and PND.  Gastrointestinal: negative for abdominal pain. Negative for heartburn, nausea, vomiting, diarrhea, constipation, blood in stool and melena.  Genitourinary: Negative for dysuria, urgency, frequency, hematuria and flank pain.  Musculoskeletal: Negative for myalgias, back pain, joint pain and falls.  Skin: Negative for itching and rash.  Neurological: Negative for dizziness, tingling, tremors, sensory change, speech change, focal weakness, seizures, loss of consciousness, weakness and headaches.  Endo/Heme/Allergies: Negative for environmental allergies and polydipsia. Does not bruise/bleed easily.  Psychiatric/Behavioral: Negative for depression, suicidal ideas, hallucinations, memory loss and substance abuse. The patient is not nervous/anxious and does not have insomnia.        Objective:  Blood pressure (!) 148/98, pulse 75, height 5' (1.524 m), weight 146 lb (66.2 kg).   Physical Exam  Vitals reviewed. Constitutional: She is oriented to person, place, and time. She appears well-developed and well-nourished.  HENT:  Head: Normocephalic and atraumatic.        Right Ear: External ear  normal.  Left Ear: External ear normal.  Nose: Nose normal.  Mouth/Throat: Oropharynx is clear and moist.  Eyes: Conjunctivae and EOM are normal. Pupils are equal, round, and reactive to light. Right eye exhibits no discharge. Left eye exhibits no discharge. No scleral icterus.  Neck: Normal range of motion. Neck supple. No tracheal deviation present. No thyromegaly present.  Cardiovascular: Normal rate, regular rhythm, normal heart sounds and intact distal pulses.  Exam reveals no gallop and no friction rub.   No murmur heard. Respiratory: Effort normal and breath sounds normal. No respiratory distress. She has no wheezes. She has no rales. She exhibits no tenderness.  GI: Soft. Bowel sounds are normal. She exhibits no distension and no mass. There is no tenderness. There is no rebound and no guarding.  Genitourinary:  Breasts no masses skin changes or nipple changes bilaterally      Vulva is normal without lesions Vagina is pink moist without discharge Cervix normal in appearance and pap is done Uterus is normal size shape and contour Adnexa is negative with normal sized ovaries   Musculoskeletal: Normal range of motion. She exhibits no edema and no tenderness.  Neurological: She is alert and oriented to person, place, and time. She has normal reflexes. She displays normal reflexes. No cranial nerve deficit. She exhibits normal muscle tone. Coordination normal.  Skin: Skin is warm and dry. No rash noted. No erythema. No pallor.  Psychiatric: She has a normal mood and affect. Her behavior is normal. Judgment and thought content normal.       Medications Ordered at today's visit: Meds ordered this encounter  Medications   NUVESSA 1.3 % GEL    Sig: Place 1 applicator vaginally once for 1 dose.    Dispense:  5 g    Refill:  1    Other orders placed at today's visit: No orders of the defined types were placed in this encounter.     Assessment:    Normal Gyn exam.   BV Plan:     Contraception: status post hysterectomy. Follow up in: 3 years. Maebelle Munroe for the BV      Return in about 3 years (around 12/30/2024).

## 2022-01-09 ENCOUNTER — Telehealth: Payer: Self-pay | Admitting: *Deleted

## 2022-01-09 NOTE — Telephone Encounter (Signed)
Insurance is requiring a prior authorization on [place]  Covered medications are metronidazole or clindamycin if either are able to be prescribed.

## 2022-01-10 ENCOUNTER — Other Ambulatory Visit: Payer: Self-pay | Admitting: Internal Medicine

## 2022-01-10 DIAGNOSIS — R140 Abdominal distension (gaseous): Secondary | ICD-10-CM

## 2022-01-10 DIAGNOSIS — K219 Gastro-esophageal reflux disease without esophagitis: Secondary | ICD-10-CM

## 2022-01-12 ENCOUNTER — Telehealth: Payer: Self-pay | Admitting: Obstetrics & Gynecology

## 2022-01-12 NOTE — Telephone Encounter (Signed)
Pt would like a call about her results

## 2022-01-12 NOTE — Telephone Encounter (Signed)
Returned patient's call.  States she has still not received her medication.  Informed patient we did receive something from her pharmacy on Friday and a message was sent to Dr Elonda Husky.  Upon looking at the phone encounter from Friday, it was not sent to him.  Will resend to him but patient made aware he was not in the office this week but covering at the hospital.  Advised to check with pharmacy tomorrow and let us know if she did not hear anything from them.

## 2022-01-13 ENCOUNTER — Other Ambulatory Visit: Payer: Self-pay | Admitting: Obstetrics & Gynecology

## 2022-01-13 MED ORDER — METRONIDAZOLE 0.75 % VA GEL: VAGINAL | 0 refills | Status: DC

## 2022-01-26 ENCOUNTER — Ambulatory Visit (INDEPENDENT_AMBULATORY_CARE_PROVIDER_SITE_OTHER): Payer: Commercial Managed Care - PPO | Admitting: Internal Medicine

## 2022-01-26 ENCOUNTER — Encounter: Payer: Self-pay | Admitting: Internal Medicine

## 2022-01-26 VITALS — BP 132/82 | HR 87 | Resp 18 | Ht 60.0 in | Wt 144.8 lb

## 2022-01-26 DIAGNOSIS — Z2821 Immunization not carried out because of patient refusal: Secondary | ICD-10-CM | POA: Insufficient documentation

## 2022-01-26 DIAGNOSIS — L239 Allergic contact dermatitis, unspecified cause: Secondary | ICD-10-CM | POA: Insufficient documentation

## 2022-01-26 DIAGNOSIS — E782 Mixed hyperlipidemia: Secondary | ICD-10-CM

## 2022-01-26 DIAGNOSIS — Z0001 Encounter for general adult medical examination with abnormal findings: Secondary | ICD-10-CM

## 2022-01-26 DIAGNOSIS — Z114 Encounter for screening for human immunodeficiency virus [HIV]: Secondary | ICD-10-CM

## 2022-01-26 DIAGNOSIS — I10 Essential (primary) hypertension: Secondary | ICD-10-CM | POA: Diagnosis not present

## 2022-01-26 DIAGNOSIS — Z1159 Encounter for screening for other viral diseases: Secondary | ICD-10-CM

## 2022-01-26 DIAGNOSIS — F419 Anxiety disorder, unspecified: Secondary | ICD-10-CM

## 2022-01-26 DIAGNOSIS — E559 Vitamin D deficiency, unspecified: Secondary | ICD-10-CM | POA: Diagnosis not present

## 2022-01-26 MED ORDER — TRIAMCINOLONE ACETONIDE 0.1 % EX CREA: 1.0000 | TOPICAL_CREAM | Freq: Two times a day (BID) | CUTANEOUS | 0 refills | Status: DC

## 2022-01-26 NOTE — Progress Notes (Signed)
Established Patient Office Visit  Subjective:  Patient ID: Regina Castillo, female    DOB: 01/30/1971  Age: 51 y.o. MRN: [id number]  CC:  Chief Complaint  Patient presents with   Hypertension   Anxiety    HPI Regina Castillo is a 51 y.o. female with past medical history of hypertension, GERD, GAD and chronic neck pain who presents for annual physical.  HTN: BP is well-controlled. Takes medications regularly. Patient denies headache, dizziness, chest pain, dyspnea or palpitations.  GAD: She has work related stress/anxiety.  Few of her colleagues were recently fired and her workload has been more recently.  She has had spells of anxiety in the past, which has required a break from work.  She has also done [facility] therapy.  She was given Celexa in the past, but did not prefer to take any medicine for anxiety.  She has tried relaxation techniques for it.  Rash: She complains of rash over her left arm, back and abdominal wall area.  Denies any insect bite.  She has redness in the area initially, which improves with coconut oil.  Rash is recurrent.    Past Medical History:  Diagnosis Date   Cyst of ovary, left    Fatty liver    Hypertension    Irritable bowel syndrome    Never fully evaluated for this.    Past Surgical History:  Procedure Laterality Date   ABDOMINAL HYSTERECTOMY     CESAREAN SECTION      Family History  Problem Relation Age of Onset   Diabetes Father    Diabetes Mother    Hypertension Mother    Colon cancer Neg Hx     Social History   Socioeconomic History   Marital status: Single    Spouse name: Not on file   Number of children: 3   Years of education: Not on file   Highest education level: Not on file  Occupational History   Occupation: [employer]  Tobacco Use   Smoking status: Some Days    Packs/day: 0.25    Years: 15.00    Total pack years: 3.75    Types: Cigarettes   Smokeless tobacco: Never  Vaping Use   Vaping Use: Never used  Substance and  Sexual Activity   Alcohol use: Yes    Comment: occasionally   Drug use: No   Sexual activity: Yes    Birth control/protection: Surgical    Comment: hyst  Other Topics Concern   Not on file  Social History Narrative   Works for [employer] Single, not dating. Live with mother. Has grown children, 3 boys.  Eats meat fruits and vegetables.  Wears seatbelt.  Currently lives with mother.   Social Determinants of Health   Financial Resource Strain: Low Risk  (08/13/2021)   Overall Financial Resource Strain (CARDIA)    Difficulty of Paying Living Expenses: Not hard at all  Food Insecurity: No Food Insecurity (12/30/2021)   Hunger Vital Sign    Worried About Running Out of Food in the Last Year: Never true    Ran Out of Food in the Last Year: Never true  Transportation Needs: No Transportation Needs (12/30/2021)   PRAPARE - Hydrologist (Medical): No    Lack of Transportation (Non-Medical): No  Physical Activity: Inactive (12/30/2021)   Exercise Vital Sign    Days of Exercise per Week: 0 days    Minutes of Exercise per Session: 0 min  Stress: Stress Concern  Present (12/30/2021)   [place]    Feeling of Stress : To some extent  Social Connections: Moderately Isolated (12/30/2021)   Social Connection and Isolation Panel [NHANES]    Frequency of Communication with Friends and Family: More than three times a week    Frequency of Social Gatherings with Friends and Family: Once a week    Attends Religious Services: More than 4 times per year    Active Member of [employer] or Organizations: No    Attends Archivist Meetings: Never    Marital Status: Never married  Intimate Partner Violence: Not At Risk (12/30/2021)   Humiliation, Afraid, Rape, and Kick questionnaire    Fear of Current or Ex-Partner: No    Emotionally Abused: No    Physically Abused: No    Sexually Abused: No    Outpatient  Medications Prior to Visit  Medication Sig Dispense Refill   amLODipine (NORVASC) 5 MG tablet Take 1 tablet (5 mg total) by mouth daily. 90 tablet 1   Biotin w/ Vitamins C & E (HAIR/SKIN/NAILS PO) Take by mouth.     ELDERBERRY PO Take by mouth.     Fexofenadine HCl (ALLEGRA PO) Take by mouth.     hydrochlorothiazide (MICROZIDE) 12.5 MG capsule Take 1 capsule (12.5 mg total) by mouth daily. 90 capsule 1   metroNIDAZOLE (METROGEL VAGINAL) 0.75 % vaginal gel Nightly x 5 nights 70 g 0   omeprazole (PRILOSEC) 40 MG capsule Take 1 capsule by mouth once daily 90 capsule 0   Probiotic, Lactobacillus, CAPS Take 1 capsule by mouth daily. (Patient not taking: Reported on 12/30/2021) 90 capsule 1   ondansetron (ZOFRAN) 4 MG tablet Take 1 tablet (4 mg total) by mouth every 8 (eight) hours as needed for nausea or vomiting. (Patient not taking: Reported on 12/30/2021) 20 tablet 0   No facility-administered medications prior to visit.    No Known Allergies  ROS Review of Systems  Constitutional:  Negative for chills and fever.  HENT:  Negative for congestion, postnasal drip and sinus pressure.   Eyes:  Negative for pain and discharge.  Respiratory:  Negative for cough and shortness of breath.   Cardiovascular:  Negative for chest pain and palpitations.  Gastrointestinal:  Negative for abdominal pain, diarrhea, nausea and vomiting.  Endocrine: Negative for polydipsia and polyuria.  Genitourinary:  Negative for dysuria and hematuria.  Musculoskeletal:  Positive for back pain and neck pain. Negative for neck stiffness.       B/l wrist pain  Skin:  Positive for rash.  Neurological:  Positive for headaches. Negative for dizziness, syncope, weakness and numbness.  Psychiatric/Behavioral:  Positive for sleep disturbance. Negative for agitation and behavioral problems. The patient is nervous/anxious.       Objective:    Physical Exam Vitals reviewed.  Constitutional:      General: She is not in acute  distress.    Appearance: She is not diaphoretic.  HENT:     Head: Normocephalic and atraumatic.     Nose: No congestion.     Mouth/Throat:     Mouth: Mucous membranes are moist.  Eyes:     General: No scleral icterus.    Extraocular Movements: Extraocular movements intact.  Cardiovascular:     Rate and Rhythm: Normal rate and regular rhythm.     Pulses: Normal pulses.     Heart sounds: Normal heart sounds. No murmur heard. Pulmonary:     Breath sounds:  Normal breath sounds. No wheezing or rales.  Musculoskeletal:     Cervical back: Neck supple. No tenderness.     Right lower leg: No edema.     Left lower leg: No edema.  Skin:    General: Skin is warm.     Findings: Rash (Erythematous patches over left antecubital area, left lower back and right-sided LLQ abdominal area) present.  Neurological:     General: No focal deficit present.     Mental Status: She is alert and oriented to person, place, and time.  Psychiatric:        Mood and Affect: Mood normal.        Behavior: Behavior normal.     BP 132/82 (BP Location: Right Arm, Patient Position: Sitting, Cuff Size: Normal)   Pulse 87   Resp 18   Ht 5' (1.524 m)   Wt 144 lb 12.8 oz (65.7 kg)   SpO2 98%   BMI 28.28 kg/m  Wt Readings from Last 3 Encounters:  01/26/22 144 lb 12.8 oz (65.7 kg)  12/30/21 146 lb (66.2 kg)  07/25/21 143 lb 6.4 oz (65 kg)    Lab Results  Component Value Date   TSH 0.930 01/17/2021   Lab Results  Component Value Date   WBC 5.5 01/17/2021   HGB 13.6 01/17/2021   HCT 38.0 01/17/2021   MCV 88 01/17/2021   PLT 426 01/17/2021   Lab Results  Component Value Date   NA 138 01/17/2021   K 3.5 01/17/2021   CO2 27 01/17/2021   GLUCOSE 75 01/17/2021   BUN 9 01/17/2021   CREATININE 0.62 01/17/2021   BILITOT 0.3 01/17/2021   ALKPHOS 78 01/17/2021   AST 26 01/17/2021   ALT 15 01/17/2021   PROT 7.6 01/17/2021   ALBUMIN 4.6 01/17/2021   CALCIUM 9.5 01/17/2021   EGFR 108 01/17/2021   Lab  Results  Component Value Date   CHOL 206 (H) 01/17/2021   Lab Results  Component Value Date   HDL 97 01/17/2021   Lab Results  Component Value Date   LDLCALC 89 01/17/2021   Lab Results  Component Value Date   TRIG 115 01/17/2021   Lab Results  Component Value Date   CHOLHDL 2.1 01/17/2021   No results found for: "HGBA1C"    Assessment & Plan:   Problem List Items Addressed This Visit       Cardiovascular and Mediastinum   Primary hypertension - Primary    BP Readings from Last 1 Encounters:  01/26/22 132/82  Usually well-controlled with Amlodipine 5 mg QD Counseled for compliance with the medications Advised DASH diet and moderate exercise/walking, at least 150 mins/week      Relevant Orders   TSH   CMP14+EGFR     Musculoskeletal and Integument   Allergic contact dermatitis    Her rash over her arms and abdominal wall likely due to contact dermatitis Kenalog cream prescribed      Relevant Medications   triamcinolone cream (KENALOG) 0.1 %     Other   Anxiety       01/26/2022   11:13 AM 12/30/2021    3:32 PM 12/02/2021   11:24 AM 09/30/2021   11:39 AM  GAD 7 : Generalized Anxiety Score  Nervous, Anxious, on Edge 3 1 0 3  Control/stop worrying 0 1 0 3  Worry too much - different things 0 1 0 3  Trouble relaxing 0 1 0 3  Restless 0 0 0 0  Easily annoyed or irritable 1 1 0 3  Afraid - awful might happen 0 0 0 0  Total GAD 7 Score 4 5 0 15  Anxiety Difficulty Somewhat difficult  Not difficult at all Somewhat difficult  Did not continue Celexa, discontinued Has work related stress, will fill out paperwork once received Prefers to try relaxation techniques, material provided Continue BH therapy      Refused influenza vaccine   Other Visit Diagnoses     Mixed hyperlipidemia       Relevant Orders   Lipid panel   Vitamin D deficiency       Relevant Orders   VITAMIN D 25 Hydroxy (Vit-D Deficiency, Fractures)   Need for hepatitis C screening test        Relevant Orders   Hepatitis C Antibody   Screening for HIV (human immunodeficiency virus)       Relevant Orders   HIV antibody (with reflex)   Encounter for general adult medical examination with abnormal findings       Relevant Orders   Hemoglobin A1c   CBC with Differential/Platelet       Meds ordered this encounter  Medications   triamcinolone cream (KENALOG) 0.1 %    Sig: Apply 1 Application topically 2 (two) times daily.    Dispense:  30 g    Refill:  0    Follow-up: Return in about 6 months (around 07/28/2022) for HTN and GAD.    Lindell Spar, MD

## 2022-01-26 NOTE — Assessment & Plan Note (Addendum)
BP Readings from Last 1 Encounters:  01/26/22 132/82   Usually well-controlled with Amlodipine 5 mg QD Counseled for compliance with the medications Advised DASH diet and moderate exercise/walking, at least 150 mins/week

## 2022-01-26 NOTE — Assessment & Plan Note (Addendum)
    01/26/2022   11:13 AM 12/30/2021    3:32 PM 12/02/2021   11:24 AM 09/30/2021   11:39 AM  GAD 7 : Generalized Anxiety Score  Nervous, Anxious, on Edge 3 1 0 3  Control/stop worrying 0 1 0 3  Worry too much - different things 0 1 0 3  Trouble relaxing 0 1 0 3  Restless 0 0 0 0  Easily annoyed or irritable 1 1 0 3  Afraid - awful might happen 0 0 0 0  Total GAD 7 Score 4 5 0 15  Anxiety Difficulty Somewhat difficult  Not difficult at all Somewhat difficult   Did not continue Celexa, discontinued Has work related stress, will fill out paperwork once received Prefers to try relaxation techniques, material provided Continue [facility] therapy

## 2022-01-26 NOTE — Assessment & Plan Note (Signed)
Her rash over her arms and abdominal wall likely due to contact dermatitis Kenalog cream prescribed

## 2022-01-26 NOTE — Patient Instructions (Signed)
Please continue taking medications as prescribed.  Please continue to follow low salt diet and perform moderate exercise/walking at least 150 mins/week. 

## 2022-01-28 ENCOUNTER — Telehealth: Payer: Self-pay

## 2022-01-28 DIAGNOSIS — Z0279 Encounter for issue of other medical certificate: Secondary | ICD-10-CM

## 2022-01-28 NOTE — Telephone Encounter (Signed)
FMLA   Copied Noted sleeved 

## 2022-02-03 NOTE — Telephone Encounter (Signed)
Patient picked up forms and was faxed.  Patient said bill her for the forms

## 2022-02-04 ENCOUNTER — Telehealth: Payer: Self-pay | Admitting: Internal Medicine

## 2022-02-04 ENCOUNTER — Telehealth: Payer: Self-pay | Admitting: *Deleted

## 2022-02-04 NOTE — Telephone Encounter (Signed)
Spoke with pt advised of lab results on last tele message

## 2022-02-04 NOTE — Telephone Encounter (Signed)
LVM for pt to call the office regarding fax of labs per dr patel has low vitamin d recommended 5000 IU vitamin d daily other blood test normal

## 2022-02-04 NOTE — Telephone Encounter (Signed)
Pt returning call

## 2022-04-27 ENCOUNTER — Other Ambulatory Visit: Payer: Self-pay | Admitting: Internal Medicine

## 2022-04-27 DIAGNOSIS — I10 Essential (primary) hypertension: Secondary | ICD-10-CM

## 2022-05-01 ENCOUNTER — Telehealth: Payer: Self-pay

## 2022-05-01 NOTE — Telephone Encounter (Signed)
Spoke with patient, she said that she has some itching and irritation at the clitoris. Appointment made to see Anderson Malta on Wednesday.

## 2022-05-01 NOTE — Telephone Encounter (Signed)
Patient called and stated that she would like for a nurse to call her about getting another medication because the one she was prescribed on her last visit is not working.

## 2022-05-01 NOTE — Telephone Encounter (Signed)
Called patient back and left voicemail that I am returning her call.  ?

## 2022-05-06 ENCOUNTER — Ambulatory Visit: Payer: Commercial Managed Care - PPO | Admitting: Adult Health

## 2022-05-07 ENCOUNTER — Encounter: Payer: Self-pay | Admitting: Adult Health

## 2022-05-07 ENCOUNTER — Ambulatory Visit (INDEPENDENT_AMBULATORY_CARE_PROVIDER_SITE_OTHER): Payer: Commercial Managed Care - PPO | Admitting: Adult Health

## 2022-05-07 ENCOUNTER — Other Ambulatory Visit (HOSPITAL_COMMUNITY)
Admission: RE | Admit: 2022-05-07 | Discharge: 2022-05-07 | Disposition: A | Discharge status: Home / Self Care | Payer: Commercial Managed Care - PPO | Source: Ambulatory Visit | Attending: Adult Health | Admitting: Adult Health

## 2022-05-07 VITALS — BP 144/92 | HR 75 | Ht 60.0 in | Wt 146.0 lb

## 2022-05-07 DIAGNOSIS — N898 Other specified noninflammatory disorders of vagina: Secondary | ICD-10-CM | POA: Insufficient documentation

## 2022-05-07 MED ORDER — FLUCONAZOLE 150 MG PO TABS: ORAL_TABLET | ORAL | 1 refills | Status: DC

## 2022-05-07 NOTE — Progress Notes (Signed)
  Subjective:     Patient ID: Regina Castillo, female   DOB: 02/14/1971, 52 y.o.   MRN: [id number]  HPI Floraine is a 52 year old black female, single, sp hysterectomy in complaining of vaginal irritation, and some itching near clitoris area, had white discharge. Was treated for BV with Metrogel in September.  PCP is Dr Posey Pronto.  Review of Systems +vaginal irritation Some itching near clit White discharge Has occasional sex, not often Reviewed past medical,surgical, social and family history. Reviewed medications and allergies.     Objective:   Physical Exam BP (!) 144/92 (BP Location: Left Arm, Patient Position: Sitting, Cuff Size: Normal)   Pulse 75   Ht 5' (1.524 m)   Wt 146 lb (66.2 kg)   BMI 28.51 kg/m     Skin warm and dry.Pelvic: external genitalia is normal in appearance no lesions, vagina: white discharge without odor,urethra has no lesions or masses noted, cervix and uterus are absent,adnexa: no masses or tenderness noted. Bladder is non tender and no masses felt. CV swab obtained. Fall risk is low  Upstream - 05/07/22 1532       Pregnancy Intention Screening   Does the patient want to become pregnant in the next year? N/A    Does the patient's partner want to become pregnant in the next year? N/A    Would the patient like to discuss contraceptive options today? N/A      Contraception Wrap Up   Current Method Female Sterilization   hyst   End Method Female Sterilization   hyst   Contraception Counseling Provided No            Examination chaperoned by Levy Pupa LPN  Assessment:     1. Vaginal irritation Will rx diflucan Meds ordered this encounter  Medications   fluconazole (DIFLUCAN) 150 MG tablet    Sig: Take 1 now and 1 in 3 days    Dispense:  2 tablet    Refill:  1    Order Specific Question:   Supervising Provider    Answer:   Tania Ade H [[id number]]   CV swab sent   2. Vaginal discharge CV swab sent for GC/CHL,trich,BV and yeast     Plan:       Follow up prn

## 2022-05-11 ENCOUNTER — Other Ambulatory Visit: Payer: Self-pay | Admitting: Adult Health

## 2022-05-11 LAB — CERVICOVAGINAL ANCILLARY ONLY
Bacterial Vaginitis (gardnerella): POSITIVE — AB
Candida Glabrata: NEGATIVE
Candida Vaginitis: POSITIVE — AB
Chlamydia: NEGATIVE
Comment: NEGATIVE
Comment: NEGATIVE
Comment: NEGATIVE
Comment: NEGATIVE
Comment: NEGATIVE
Comment: NORMAL
Neisseria Gonorrhea: NEGATIVE
Trichomonas: NEGATIVE

## 2022-05-11 MED ORDER — METRONIDAZOLE 500 MG PO TABS: 500.0000 mg | ORAL_TABLET | Freq: Two times a day (BID) | ORAL | 0 refills | Status: DC

## 2022-06-19 ENCOUNTER — Telehealth: Payer: Self-pay | Admitting: Internal Medicine

## 2022-06-19 NOTE — Telephone Encounter (Signed)
Spoke to patient

## 2022-06-19 NOTE — Telephone Encounter (Signed)
Pt wants to know if nurse can please give her a call in regards to her medication

## 2022-06-21 ENCOUNTER — Other Ambulatory Visit: Payer: Self-pay | Admitting: Gastroenterology

## 2022-06-21 DIAGNOSIS — R140 Abdominal distension (gaseous): Secondary | ICD-10-CM

## 2022-06-21 DIAGNOSIS — K219 Gastro-esophageal reflux disease without esophagitis: Secondary | ICD-10-CM

## 2022-07-01 ENCOUNTER — Telehealth: Payer: Self-pay | Admitting: Internal Medicine

## 2022-07-01 ENCOUNTER — Other Ambulatory Visit: Payer: Self-pay

## 2022-07-01 DIAGNOSIS — R140 Abdominal distension (gaseous): Secondary | ICD-10-CM

## 2022-07-01 DIAGNOSIS — K219 Gastro-esophageal reflux disease without esophagitis: Secondary | ICD-10-CM

## 2022-07-01 MED ORDER — OMEPRAZOLE 40 MG PO CPDR: 40.0000 mg | DELAYED_RELEASE_CAPSULE | Freq: Every day | ORAL | 0 refills | Status: DC

## 2022-07-01 NOTE — Telephone Encounter (Signed)
Refills sent

## 2022-07-01 NOTE — Telephone Encounter (Signed)
Patient called denied refill and wants to know why omeprazole (PRILOSEC) 40 MG capsule [id number]   [facility]   Please return patient call [phone]

## 2022-07-21 ENCOUNTER — Ambulatory Visit (INDEPENDENT_AMBULATORY_CARE_PROVIDER_SITE_OTHER): Payer: Commercial Managed Care - PPO | Admitting: Internal Medicine

## 2022-07-21 ENCOUNTER — Encounter: Payer: Self-pay | Admitting: Internal Medicine

## 2022-07-21 VITALS — BP 134/86 | HR 85 | Ht 60.0 in | Wt 148.4 lb

## 2022-07-21 DIAGNOSIS — F419 Anxiety disorder, unspecified: Secondary | ICD-10-CM | POA: Diagnosis not present

## 2022-07-21 DIAGNOSIS — K219 Gastro-esophageal reflux disease without esophagitis: Secondary | ICD-10-CM | POA: Diagnosis not present

## 2022-07-21 DIAGNOSIS — I10 Essential (primary) hypertension: Secondary | ICD-10-CM | POA: Diagnosis not present

## 2022-07-21 DIAGNOSIS — R140 Abdominal distension (gaseous): Secondary | ICD-10-CM

## 2022-07-21 MED ORDER — OMEPRAZOLE 40 MG PO CPDR: 40.0000 mg | DELAYED_RELEASE_CAPSULE | Freq: Every day | ORAL | 1 refills | Status: DC

## 2022-07-21 MED ORDER — HYDROCHLOROTHIAZIDE 12.5 MG PO CAPS: 12.5000 mg | ORAL_CAPSULE | Freq: Every day | ORAL | 1 refills | Status: DC

## 2022-07-21 MED ORDER — AMLODIPINE BESYLATE 5 MG PO TABS: 5.0000 mg | ORAL_TABLET | Freq: Every day | ORAL | 1 refills | Status: DC

## 2022-07-21 NOTE — Patient Instructions (Addendum)
Please continue to take medications as prescribed.  Please continue to follow low salt diet and perform moderate exercise/walking at least 150 mins/week. 

## 2022-07-21 NOTE — Assessment & Plan Note (Addendum)
    01/26/2022   11:13 AM 12/30/2021    3:32 PM 12/02/2021   11:24 AM 09/30/2021   11:39 AM  GAD 7 : Generalized Anxiety Score  Nervous, Anxious, on Edge 3 1 0 3  Control/stop worrying 0 1 0 3  Worry too much - different things 0 1 0 3  Trouble relaxing 0 1 0 3  Restless 0 0 0 0  Easily annoyed or irritable 1 1 0 3  Afraid - awful might happen 0 0 0 0  Total GAD 7 Score 4 5 0 15  Anxiety Difficulty Somewhat difficult  Not difficult at all Somewhat difficult   Did not continue Celexa, discontinued Has work related stress Prefers to try relaxation techniques, material provided Continue BH therapy

## 2022-07-21 NOTE — Progress Notes (Signed)
Established Patient Office Visit  Subjective:  Patient ID: Regina Castillo, female    DOB: 02-19-1971  Age: 52 y.o. MRN: [id number]  CC:  Chief Complaint  Patient presents with   Hypertension    Six month follow up     HPI Regina Castillo is a 52 y.o. female with past medical history of hypertension, GERD, GAD and chronic neck pain who presents for follow-up of her chronic medical conditions.  HTN: BP is well-controlled. Takes medications regularly. Patient denies headache, dizziness, chest pain, dyspnea or palpitations.  GAD: She has work related stress/anxiety. Her workload has been more recently.  She has had spells of anxiety in the past, which has required a break from work.  She has also done [facility] therapy.  She was given Celexa in the past, but did not prefer to take any medicine for anxiety.  She has tried relaxation techniques for it. FMLA paperwork filled for severe anxiety.  She has not had updated labs yet.  Past Medical History:  Diagnosis Date   Cyst of ovary, left    Fatty liver    Hypertension    Irritable bowel syndrome    Never fully evaluated for this.    Past Surgical History:  Procedure Laterality Date   ABDOMINAL HYSTERECTOMY     CESAREAN SECTION      Family History  Problem Relation Age of Onset   Diabetes Father    Diabetes Mother    Hypertension Mother    Colon cancer Neg Hx     Social History   Socioeconomic History   Marital status: Single    Spouse name: Not on file   Number of children: 3   Years of education: Not on file   Highest education level: Not on file  Occupational History   Occupation: [employer]  Tobacco Use   Smoking status: Some Days    Packs/day: 0.25    Years: 15.00    Additional pack years: 0.00    Total pack years: 3.75    Types: Cigarettes   Smokeless tobacco: Never  Vaping Use   Vaping Use: Never used  Substance and Sexual Activity   Alcohol use: Yes    Comment: occasionally   Drug use: No   Sexual  activity: Not on file    Comment: hyst  Other Topics Concern   Not on file  Social History Narrative   Works for [employer]. Single, not dating. Live with mother. Has grown children, 3 boys.  Eats meat fruits and vegetables.  Wears seatbelt.  Currently lives with mother.   Social Determinants of Health   Financial Resource Strain: Low Risk  (08/13/2021)   Overall Financial Resource Strain (CARDIA)    Difficulty of Paying Living Expenses: Not hard at all  Food Insecurity: No Food Insecurity (12/30/2021)   Hunger Vital Sign    Worried About Running Out of Food in the Last Year: Never true    Ran Out of Food in the Last Year: Never true  Transportation Needs: No Transportation Needs (12/30/2021)   PRAPARE - Administrator, Civil Service (Medical): No    Lack of Transportation (Non-Medical): No  Physical Activity: Inactive (12/30/2021)   Exercise Vital Sign    Days of Exercise per Week: 0 days    Minutes of Exercise per Session: 0 min  Stress: Stress Concern Present (12/30/2021)   [employer] of Occupational Health - Occupational Stress Questionnaire    Feeling of Stress : To some  extent  Social Connections: Moderately Isolated (12/30/2021)   Social Connection and Isolation Panel [NHANES]    Frequency of Communication with Friends and Family: More than three times a week    Frequency of Social Gatherings with Friends and Family: Once a week    Attends Religious Services: More than 4 times per year    Active Member of [employer] or Organizations: No    Attends Banker Meetings: Never    Marital Status: Never married  Intimate Partner Violence: Not At Risk (12/30/2021)   Humiliation, Afraid, Rape, and Kick questionnaire    Fear of Current or Ex-Partner: No    Emotionally Abused: No    Physically Abused: No    Sexually Abused: No    Outpatient Medications Prior to Visit  Medication Sig Dispense Refill   Biotin w/ Vitamins C & E (HAIR/SKIN/NAILS PO) Take by mouth.      ELDERBERRY PO Take by mouth.     Fexofenadine HCl (ALLEGRA PO) Take by mouth.     triamcinolone cream (KENALOG) 0.1 % Apply 1 Application topically 2 (two) times daily. (Patient not taking: Reported on 05/07/2022) 30 g 0   amLODipine (NORVASC) 5 MG tablet Take 1 tablet by mouth once daily 90 tablet 0   fluconazole (DIFLUCAN) 150 MG tablet Take 1 now and 1 in 3 days 2 tablet 1   hydrochlorothiazide (MICROZIDE) 12.5 MG capsule Take 1 capsule (12.5 mg total) by mouth daily. 90 capsule 1   metroNIDAZOLE (FLAGYL) 500 MG tablet Take 1 tablet (500 mg total) by mouth 2 (two) times daily. 14 tablet 0   omeprazole (PRILOSEC) 40 MG capsule Take 1 capsule (40 mg total) by mouth daily. 90 capsule 0   No facility-administered medications prior to visit.    No Known Allergies  ROS Review of Systems  Constitutional:  Negative for chills and fever.  HENT:  Negative for congestion, postnasal drip and sinus pressure.   Eyes:  Negative for pain and discharge.  Respiratory:  Negative for cough and shortness of breath.   Cardiovascular:  Negative for chest pain and palpitations.  Gastrointestinal:  Negative for abdominal pain, diarrhea, nausea and vomiting.  Endocrine: Negative for polydipsia and polyuria.  Genitourinary:  Negative for dysuria and hematuria.  Musculoskeletal:  Positive for back pain and neck pain. Negative for neck stiffness.       B/l wrist pain  Skin:  Positive for rash.  Neurological:  Positive for headaches. Negative for dizziness, syncope, weakness and numbness.  Psychiatric/Behavioral:  Positive for sleep disturbance. Negative for agitation and behavioral problems. The patient is nervous/anxious.       Objective:    Physical Exam Vitals reviewed.  Constitutional:      General: She is not in acute distress.    Appearance: She is not diaphoretic.  HENT:     Head: Normocephalic and atraumatic.     Nose: No congestion.     Mouth/Throat:     Mouth: Mucous membranes are moist.   Eyes:     General: No scleral icterus.    Extraocular Movements: Extraocular movements intact.  Cardiovascular:     Rate and Rhythm: Normal rate and regular rhythm.     Pulses: Normal pulses.     Heart sounds: Normal heart sounds. No murmur heard. Pulmonary:     Breath sounds: Normal breath sounds. No wheezing or rales.  Musculoskeletal:     Cervical back: Neck supple. No tenderness.     Right lower leg: No edema.  Left lower leg: No edema.  Skin:    General: Skin is warm.     Findings: Rash (Erythematous patches over left antecubital area, left lower back and right-sided LLQ abdominal area) present.  Neurological:     General: No focal deficit present.     Mental Status: She is alert and oriented to person, place, and time.  Psychiatric:        Mood and Affect: Mood normal.        Behavior: Behavior normal.     BP 134/86 (BP Location: Right Arm, Patient Position: Sitting, Cuff Size: Normal)   Pulse 85   Ht 5' (1.524 m)   Wt 148 lb 6.4 oz (67.3 kg)   SpO2 97%   BMI 28.98 kg/m  Wt Readings from Last 3 Encounters:  07/21/22 148 lb 6.4 oz (67.3 kg)  05/07/22 146 lb (66.2 kg)  01/26/22 144 lb 12.8 oz (65.7 kg)    Lab Results  Component Value Date   TSH 0.930 01/17/2021   Lab Results  Component Value Date   WBC 5.5 01/17/2021   HGB 13.6 01/17/2021   HCT 38.0 01/17/2021   MCV 88 01/17/2021   PLT 426 01/17/2021   Lab Results  Component Value Date   NA 138 01/17/2021   K 3.5 01/17/2021   CO2 27 01/17/2021   GLUCOSE 75 01/17/2021   BUN 9 01/17/2021   CREATININE 0.62 01/17/2021   BILITOT 0.3 01/17/2021   ALKPHOS 78 01/17/2021   AST 26 01/17/2021   ALT 15 01/17/2021   PROT 7.6 01/17/2021   ALBUMIN 4.6 01/17/2021   CALCIUM 9.5 01/17/2021   EGFR 108 01/17/2021   Lab Results  Component Value Date   CHOL 206 (H) 01/17/2021   Lab Results  Component Value Date   HDL 97 01/17/2021   Lab Results  Component Value Date   LDLCALC 89 01/17/2021   Lab  Results  Component Value Date   TRIG 115 01/17/2021   Lab Results  Component Value Date   CHOLHDL 2.1 01/17/2021   No results found for: "HGBA1C"    Assessment & Plan:   Problem List Items Addressed This Visit       Cardiovascular and Mediastinum   Primary hypertension - Primary    BP Readings from Last 1 Encounters:  07/21/22 134/86  Usually well-controlled with Amlodipine 5 mg QD and HCTZ 12.5 mg QD Counseled for compliance with the medications Advised DASH diet and moderate exercise/walking, at least 150 mins/week      Relevant Medications   amLODipine (NORVASC) 5 MG tablet   hydrochlorothiazide (MICROZIDE) 12.5 MG capsule     Digestive   GERD (gastroesophageal reflux disease)    Well controlled with omeprazole Takes probiotic for bloating      Relevant Medications   omeprazole (PRILOSEC) 40 MG capsule     Other   Anxiety (Chronic)       01/26/2022   11:13 AM 12/30/2021    3:32 PM 12/02/2021   11:24 AM 09/30/2021   11:39 AM  GAD 7 : Generalized Anxiety Score  Nervous, Anxious, on Edge 3 1 0 3  Control/stop worrying 0 1 0 3  Worry too much - different things 0 1 0 3  Trouble relaxing 0 1 0 3  Restless 0 0 0 0  Easily annoyed or irritable 1 1 0 3  Afraid - awful might happen 0 0 0 0  Total GAD 7 Score 4 5 0 15  Anxiety Difficulty Somewhat  difficult  Not difficult at all Somewhat difficult  Did not continue Celexa, discontinued Has work related stress Prefers to try relaxation techniques, material provided Continue BH therapy      Relevant Orders   Ambulatory referral to Integrated Behavioral Health   Meds ordered this encounter  Medications   amLODipine (NORVASC) 5 MG tablet    Sig: Take 1 tablet (5 mg total) by mouth daily.    Dispense:  90 tablet    Refill:  1   hydrochlorothiazide (MICROZIDE) 12.5 MG capsule    Sig: Take 1 capsule (12.5 mg total) by mouth daily.    Dispense:  90 capsule    Refill:  1   omeprazole (PRILOSEC) 40 MG capsule     Sig: Take 1 capsule (40 mg total) by mouth daily.    Dispense:  90 capsule    Refill:  1    Follow-up: Return in about 6 months (around 01/20/2023) for Annual physical.    Anabel Halon, MD

## 2022-07-23 ENCOUNTER — Telehealth: Payer: Self-pay | Admitting: Internal Medicine

## 2022-07-23 DIAGNOSIS — Z0279 Encounter for issue of other medical certificate: Secondary | ICD-10-CM

## 2022-07-23 NOTE — Assessment & Plan Note (Addendum)
BP Readings from Last 1 Encounters:  07/21/22 134/86   Usually well-controlled with Amlodipine 5 mg QD and HCTZ 12.5 mg QD Counseled for compliance with the medications Advised DASH diet and moderate exercise/walking, at least 150 mins/week

## 2022-07-23 NOTE — Telephone Encounter (Signed)
FMLA due back 05.03.2024 [phone]  Copied  Noted sleeved

## 2022-07-23 NOTE — Assessment & Plan Note (Addendum)
Well controlled with omeprazole Takes probiotic for bloating

## 2022-07-28 NOTE — Telephone Encounter (Signed)
Spoke to patient forms were faxed, patient will pick up a copy

## 2022-07-30 ENCOUNTER — Ambulatory Visit: Payer: Commercial Managed Care - PPO | Admitting: Internal Medicine

## 2022-08-20 IMAGING — DX DG CERVICAL SPINE COMPLETE 4+V
5 series · 5 of 5 positions shown · non-contrast
Comparison: None.

CLINICAL DATA: Left neck pain

EXAM:
CERVICAL SPINE - COMPLETE 4+ VIEW

[neck lat]
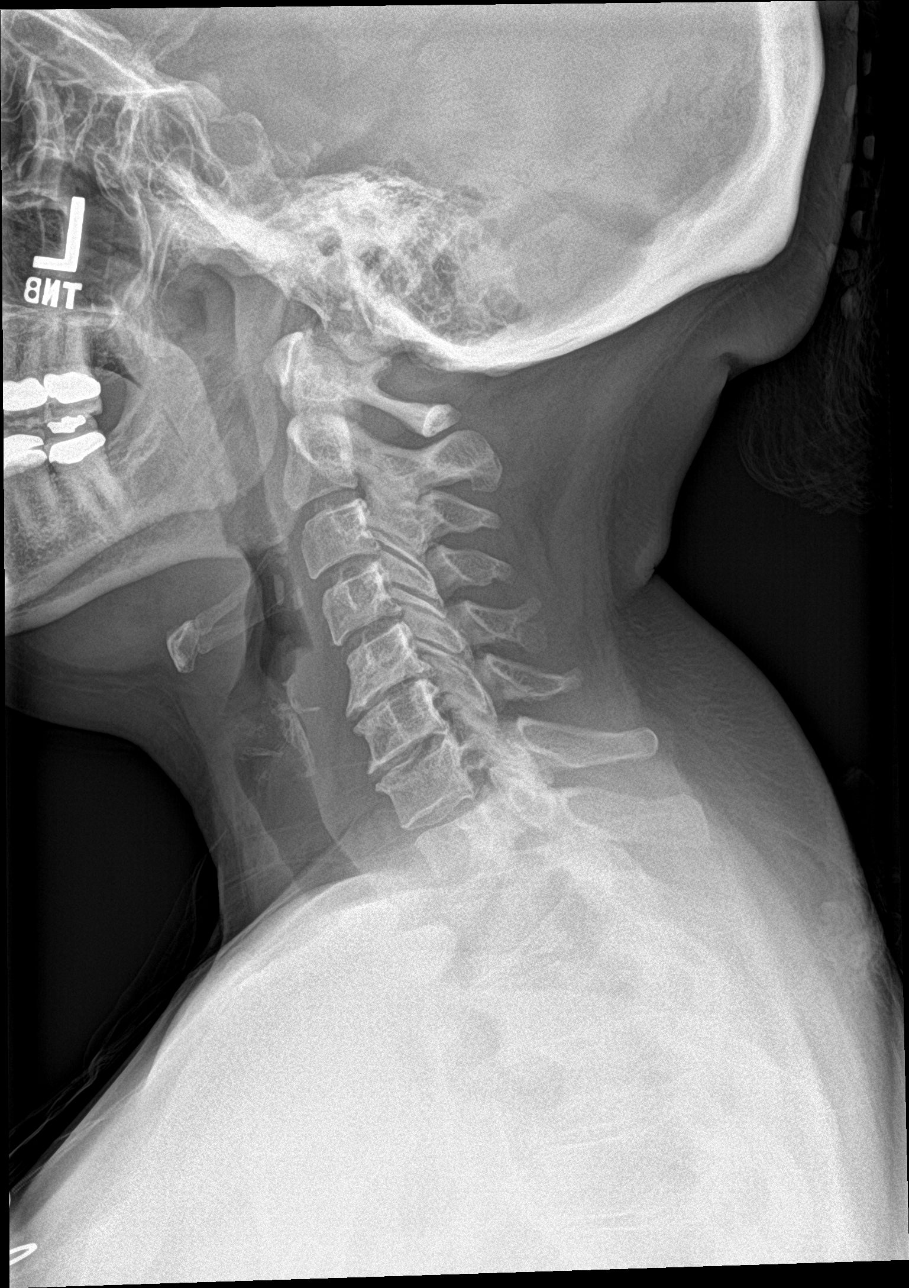

[neck ap (1 of 4)]
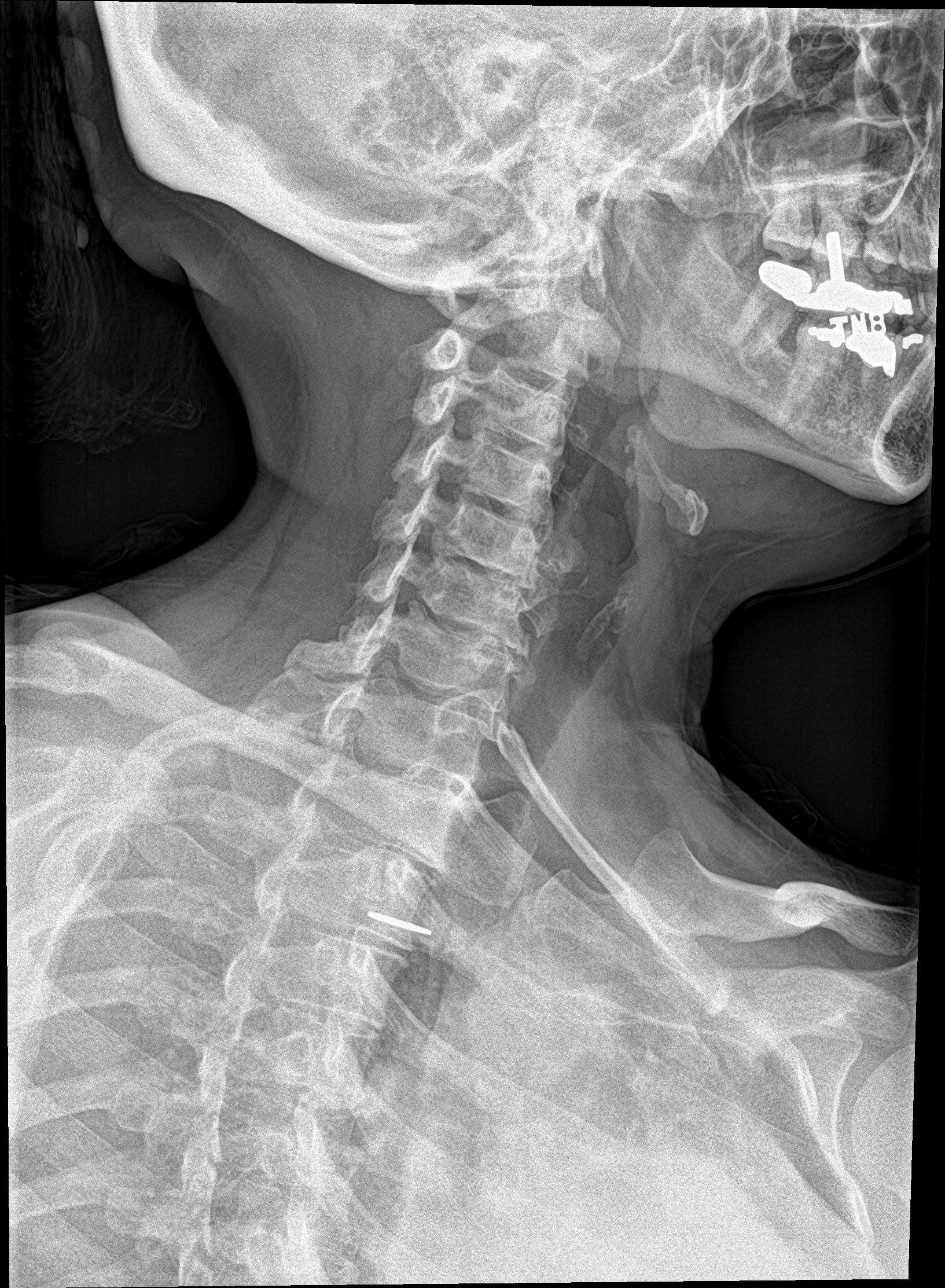

[neck ap (2 of 4)]
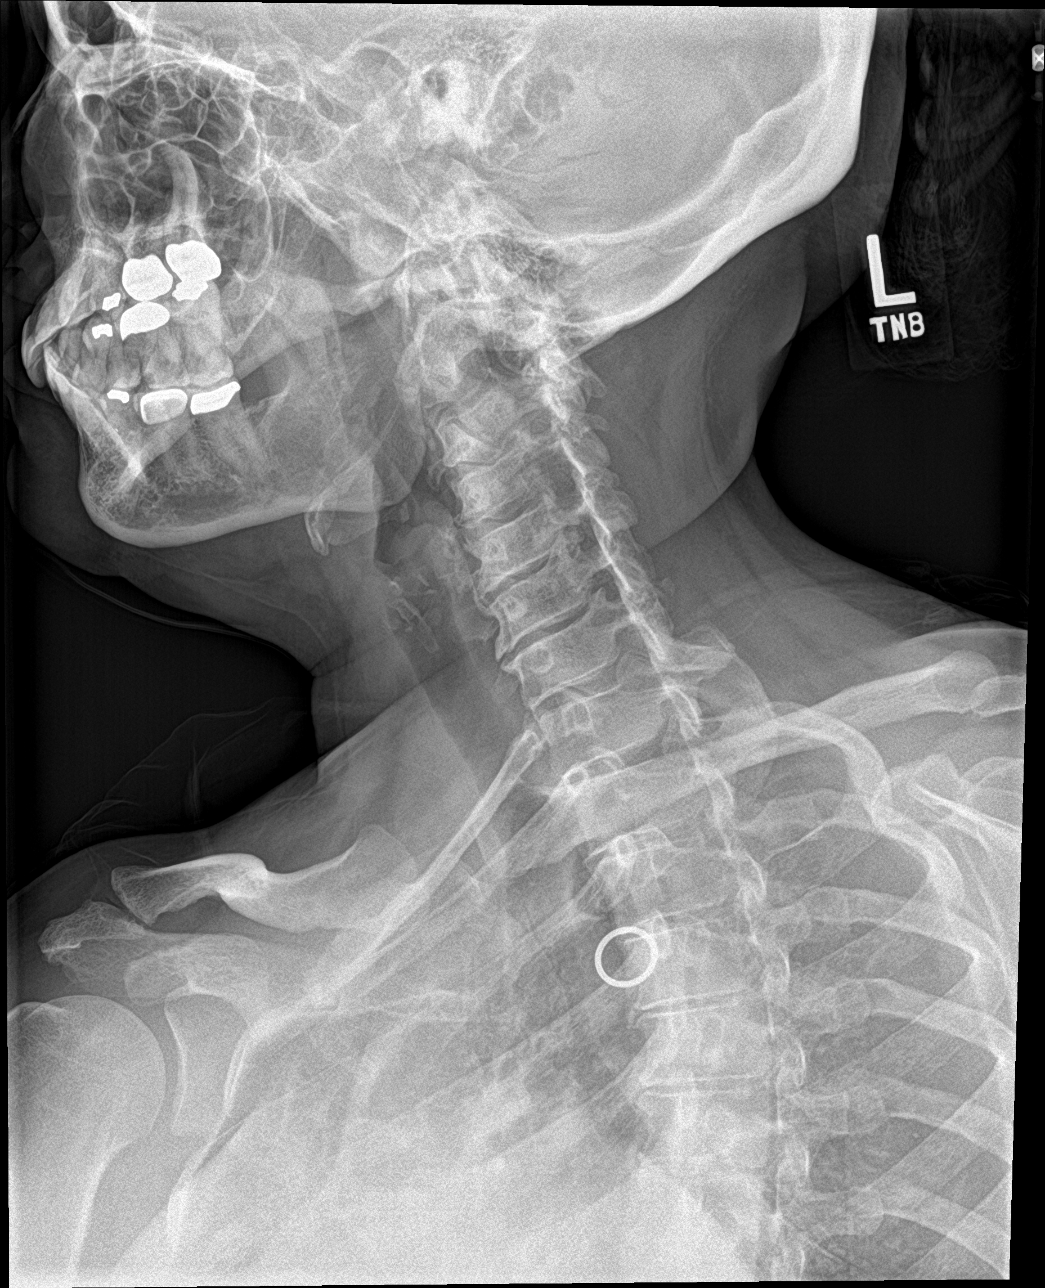

[neck ap (3 of 4)]
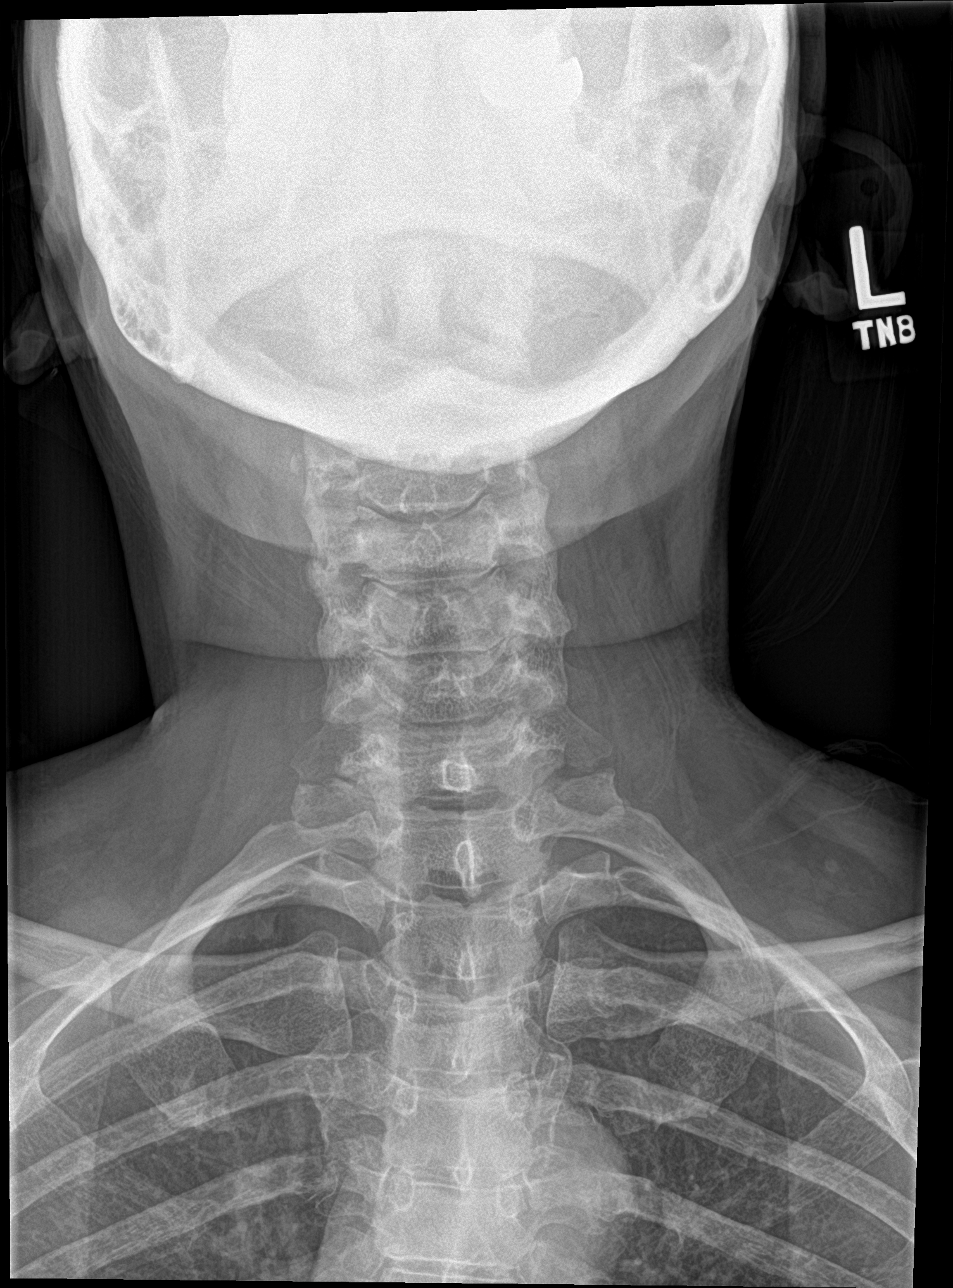

[neck ap (4 of 4)]
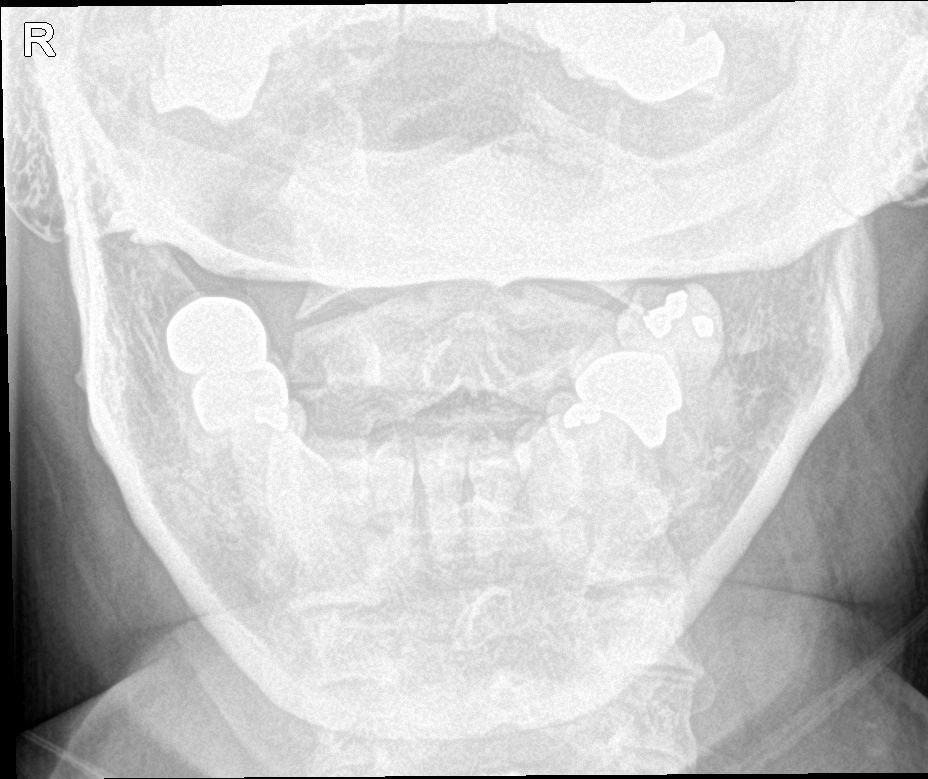

[5 of 5 positions shown; findings below may reference images not displayed]

FINDINGS: Five view radiograph cervical spine demonstrates normal cervical
lordosis. No acute fracture or listhesis of the cervical spine.
Vertebral body height has been preserved. There is intervertebral
disc space narrowing and endplate remodeling of C5-C7 in keeping
with changes of mild to moderate degenerative disc disease.
Posteriorly oriented disc osteophytes are noted at C5-6. The spinal
canal, however, is widely patent. Prevertebral soft tissues are not
thickened. Oblique views demonstrates at least mild neural foraminal
narrowing at C3-C7 bilaterally secondary to uncovertebral arthrosis.
Odontoid view is limited by overlying osseous artifact
IMPRESSION: Degenerative disc and degenerative joint disease with multilevel
mild neural foraminal narrowing as described above.

## 2022-11-20 ENCOUNTER — Encounter: Payer: Self-pay | Admitting: Internal Medicine

## 2022-11-20 ENCOUNTER — Ambulatory Visit (INDEPENDENT_AMBULATORY_CARE_PROVIDER_SITE_OTHER): Payer: Commercial Managed Care - PPO | Admitting: Internal Medicine

## 2022-11-20 VITALS — BP 138/84 | HR 86 | Ht 60.0 in | Wt 148.4 lb

## 2022-11-20 DIAGNOSIS — R0683 Snoring: Secondary | ICD-10-CM | POA: Insufficient documentation

## 2022-11-20 DIAGNOSIS — M791 Myalgia, unspecified site: Secondary | ICD-10-CM

## 2022-11-20 DIAGNOSIS — I10 Essential (primary) hypertension: Secondary | ICD-10-CM

## 2022-11-20 DIAGNOSIS — R5382 Chronic fatigue, unspecified: Secondary | ICD-10-CM | POA: Diagnosis not present

## 2022-11-20 DIAGNOSIS — F419 Anxiety disorder, unspecified: Secondary | ICD-10-CM

## 2022-11-20 MED ORDER — DULOXETINE HCL 30 MG PO CPEP: 30.0000 mg | ORAL_CAPSULE | Freq: Every day | ORAL | 2 refills | Status: DC

## 2022-11-20 NOTE — Assessment & Plan Note (Signed)
BP Readings from Last 1 Encounters:  11/20/22 138/84   Usually well-controlled with Amlodipine 5 mg QD and HCTZ 12.5 mg QD Counseled for compliance with the medications Advised DASH diet and moderate exercise/walking, at least 150 mins/week

## 2022-11-20 NOTE — Patient Instructions (Signed)
Please start taking Cymbalta as prescribed.  Please start taking Magnesium oxide 200 mg once daily and Vitamin B12 500 mcg once daily.  Please get blood tests done within 2 weeks.  You are being scheduled to get home sleep study.  Please maintain simple sleep hygiene. - Maintain dark and non-noisy environment in the bedroom. - Please use the bedroom for sleep and sexual activity only. - Do not use electronic devices in the bedroom. - Please take dinner at least 2 hours before bedtime. - Please avoid caffeinated products in the evening, including coffee, soft drinks. - Please try to maintain the regular sleep-wake cycle - Go to bed and wake up at the same time.

## 2022-11-20 NOTE — Assessment & Plan Note (Addendum)
Could be multifactorial Her work related stress/GAD is mostly contributing to her fatigue She has hot flashes from menopause, but less likely to be contributing to her chronic fatigue-since she currently smokes, not a candidate for HRT OSA can be contributing to fatigue, check home sleep study Check CMP and TSH Advised to start taking magnesium supplement, can also help with leg cramps and take vitamin B12 supplement Questionable fibromyalgia -added Cymbalta, can help with myalgias as well

## 2022-11-20 NOTE — Progress Notes (Signed)
Established Patient Office Visit  Subjective:  Patient ID: Regina Castillo, female    DOB: 14-Feb-1971  Age: 52 y.o. MRN: [id number]  CC:  Chief Complaint  Patient presents with   Fatigue    Patient is extremely tired   Spasms    Patient is having muscle spasms     HPI Regina Castillo is a 52 y.o. female with past medical history of hypertension, GERD, GAD and chronic neck pain who presents for follow-up of her chronic medical conditions.  HTN: BP is well-controlled. Takes medications regularly. Patient denies headache, chest pain, dyspnea or palpitations.  Chronic fatigue and myalgias: She reports daytime fatigue and muscle aches in bilateral upper extremities.  Denies any recent injury.  She reports having snoring and frequent nightmares.  Denies any active bleeding.  She attributes her fatigue to her work related stress in addition to menopause.  She also reports muscle cramps in the legs when she tries to do stretching.  GAD: She has work related stress/anxiety. Her workload has been more recently.  She has had spells of anxiety in the past, which has required a break from work.  She has also done [facility] therapy.  She was given Celexa in the past, but did not prefer to take any medicine for anxiety.  She has tried relaxation techniques for it. FMLA paperwork has been filled for severe anxiety.   Past Medical History:  Diagnosis Date   Cyst of ovary, left    Fatty liver    Hypertension    Irritable bowel syndrome    Never fully evaluated for this.    Past Surgical History:  Procedure Laterality Date   ABDOMINAL HYSTERECTOMY     CESAREAN SECTION      Family History  Problem Relation Age of Onset   Diabetes Father    Diabetes Mother    Hypertension Mother    Colon cancer Neg Hx     Social History   Socioeconomic History   Marital status: Single    Spouse name: Not on file   Number of children: 3   Years of education: Not on file   Highest education level: Not  on file  Occupational History   Occupation: [employer]  Tobacco Use   Smoking status: Some Days    Current packs/day: 0.25    Average packs/day: 0.3 packs/day for 15.0 years (3.8 ttl pk-yrs)    Types: Cigarettes   Smokeless tobacco: Never  Vaping Use   Vaping status: Never Used  Substance and Sexual Activity   Alcohol use: Yes    Comment: occasionally   Drug use: No   Sexual activity: Not on file    Comment: hyst  Other Topics Concern   Not on file  Social History Narrative   Works for [employer]. Single, not dating. Live with mother. Has grown children, 3 boys.  Eats meat fruits and vegetables.  Wears seatbelt.  Currently lives with mother.   Social Determinants of Health   Financial Resource Strain: Low Risk  (08/13/2021)   Overall Financial Resource Strain (CARDIA)    Difficulty of Paying Living Expenses: Not hard at all  Food Insecurity: No Food Insecurity (12/30/2021)   Hunger Vital Sign    Worried About Running Out of Food in the Last Year: Never true    Ran Out of Food in the Last Year: Never true  Transportation Needs: No Transportation Needs (12/30/2021)   PRAPARE - Administrator, Civil Service (Medical): No  Lack of Transportation (Non-Medical): No  Physical Activity: Inactive (12/30/2021)   Exercise Vital Sign    Days of Exercise per Week: 0 days    Minutes of Exercise per Session: 0 min  Stress: Stress Concern Present (12/30/2021)   [employer] of Occupational Health - Occupational Stress Questionnaire    Feeling of Stress : To some extent  Social Connections: Moderately Isolated (12/30/2021)   Social Connection and Isolation Panel [NHANES]    Frequency of Communication with Friends and Family: More than three times a week    Frequency of Social Gatherings with Friends and Family: Once a week    Attends Religious Services: More than 4 times per year    Active Member of [employer] or Organizations: No    Attends Banker Meetings: Never    Marital  Status: Never married  Intimate Partner Violence: Not At Risk (12/30/2021)   Humiliation, Afraid, Rape, and Kick questionnaire    Fear of Current or Ex-Partner: No    Emotionally Abused: No    Physically Abused: No    Sexually Abused: No    Outpatient Medications Prior to Visit  Medication Sig Dispense Refill   amLODipine (NORVASC) 5 MG tablet Take 1 tablet (5 mg total) by mouth daily. 90 tablet 1   Biotin w/ Vitamins C & E (HAIR/SKIN/NAILS PO) Take by mouth.     ELDERBERRY PO Take by mouth.     Fexofenadine HCl (ALLEGRA PO) Take by mouth.     hydrochlorothiazide (MICROZIDE) 12.5 MG capsule Take 1 capsule (12.5 mg total) by mouth daily. 90 capsule 1   omeprazole (PRILOSEC) 40 MG capsule Take 1 capsule (40 mg total) by mouth daily. 90 capsule 1   triamcinolone cream (KENALOG) 0.1 % Apply 1 Application topically 2 (two) times daily. (Patient not taking: Reported on 05/07/2022) 30 g 0   No facility-administered medications prior to visit.    No Known Allergies  ROS Review of Systems  Constitutional:  Positive for fatigue. Negative for chills and fever.  HENT:  Negative for congestion, postnasal drip and sinus pressure.   Eyes:  Negative for pain and discharge.  Respiratory:  Negative for cough and shortness of breath.   Cardiovascular:  Negative for chest pain and palpitations.  Gastrointestinal:  Negative for abdominal pain, diarrhea, nausea and vomiting.  Endocrine: Negative for polydipsia and polyuria.  Genitourinary:  Negative for dysuria and hematuria.  Musculoskeletal:  Positive for back pain, myalgias and neck pain. Negative for neck stiffness.       B/l wrist pain  Skin:  Positive for rash.  Neurological:  Negative for dizziness, syncope, weakness and numbness.  Psychiatric/Behavioral:  Positive for sleep disturbance. Negative for agitation and behavioral problems. The patient is nervous/anxious.       Objective:    Physical Exam Vitals reviewed.  Constitutional:       General: She is not in acute distress.    Appearance: She is not diaphoretic.  HENT:     Head: Normocephalic and atraumatic.     Nose: No congestion.     Mouth/Throat:     Mouth: Mucous membranes are moist.  Eyes:     General: No scleral icterus.    Extraocular Movements: Extraocular movements intact.  Cardiovascular:     Rate and Rhythm: Normal rate and regular rhythm.     Pulses: Normal pulses.     Heart sounds: Normal heart sounds. No murmur heard. Pulmonary:     Breath sounds: Normal breath sounds. No  wheezing or rales.  Musculoskeletal:     Cervical back: Neck supple. No tenderness.     Right lower leg: No edema.     Left lower leg: No edema.  Skin:    General: Skin is warm.     Findings: No rash.  Neurological:     General: No focal deficit present.     Mental Status: She is alert and oriented to person, place, and time.  Psychiatric:        Mood and Affect: Mood normal.        Behavior: Behavior normal.     BP 138/84 (BP Location: Left Arm)   Pulse 86   Ht 5' (1.524 m)   Wt 148 lb 6.4 oz (67.3 kg)   SpO2 98%   BMI 28.98 kg/m  Wt Readings from Last 3 Encounters:  11/20/22 148 lb 6.4 oz (67.3 kg)  07/21/22 148 lb 6.4 oz (67.3 kg)  05/07/22 146 lb (66.2 kg)    Lab Results  Component Value Date   TSH 0.930 01/17/2021   Lab Results  Component Value Date   WBC 5.5 01/17/2021   HGB 13.6 01/17/2021   HCT 38.0 01/17/2021   MCV 88 01/17/2021   PLT 426 01/17/2021   Lab Results  Component Value Date   NA 138 01/17/2021   K 3.5 01/17/2021   CO2 27 01/17/2021   GLUCOSE 75 01/17/2021   BUN 9 01/17/2021   CREATININE 0.62 01/17/2021   BILITOT 0.3 01/17/2021   ALKPHOS 78 01/17/2021   AST 26 01/17/2021   ALT 15 01/17/2021   PROT 7.6 01/17/2021   ALBUMIN 4.6 01/17/2021   CALCIUM 9.5 01/17/2021   EGFR 108 01/17/2021   Lab Results  Component Value Date   CHOL 206 (H) 01/17/2021   Lab Results  Component Value Date   HDL 97 01/17/2021   Lab Results   Component Value Date   LDLCALC 89 01/17/2021   Lab Results  Component Value Date   TRIG 115 01/17/2021   Lab Results  Component Value Date   CHOLHDL 2.1 01/17/2021   No results found for: "HGBA1C"    Assessment & Plan:   Problem List Items Addressed This Visit       Cardiovascular and Mediastinum   Primary hypertension    BP Readings from Last 1 Encounters:  11/20/22 138/84   Usually well-controlled with Amlodipine 5 mg QD and HCTZ 12.5 mg QD Counseled for compliance with the medications Advised DASH diet and moderate exercise/walking, at least 150 mins/week        Other   Anxiety (Chronic)       11/20/2022    8:09 AM 07/21/2022    3:51 PM 01/26/2022   11:13 AM 12/30/2021    3:32 PM  GAD 7 : Generalized Anxiety Score  Nervous, Anxious, on Edge 2 1 3 1   Control/stop worrying 1 1 0 1  Worry too much - different things 0 2 0 1  Trouble relaxing 0 0 0 1  Restless 0 0 0 0  Easily annoyed or irritable 2 1 1 1   Afraid - awful might happen 0 0 0 0  Total GAD 7 Score 5 5 4 5   Anxiety Difficulty Not difficult at all Somewhat difficult Somewhat difficult    Did not continue Celexa, discontinued Started Cymbalta 30 mg QD - plan to increase dose as tolerated Has work related stress Prefers to try relaxation techniques, material provided Continue BH therapy  Relevant Medications   DULoxetine (CYMBALTA) 30 MG capsule   Snoring - Primary    She has snoring, daytime fatigue and history of HTN STOP-BANG: 5 At high risk for moderate to severe OSA Check home sleep study      Relevant Orders   Home sleep test   Chronic fatigue    Could be multifactorial Her work related stress/GAD is mostly contributing to her fatigue She has hot flashes from menopause, but less likely to be contributing to her chronic fatigue-since she currently smokes, not a candidate for HRT OSA can be contributing to fatigue, check home sleep study Check CMP and TSH Advised to start taking  magnesium supplement, can also help with leg cramps and take vitamin B12 supplement Questionable fibromyalgia -added Cymbalta, can help with myalgias as well       Relevant Orders   CMP14+EGFR   TSH + free T4   B12   Myalgia    OSA can lead to inadequate sleep, can lead to myalgias Questionable fibromyalgia -added Cymbalta, can help with myalgias as well Advised to take magnesium and vitamin B12 supplements Check CBC, TSH and vitamin B-12 levels If persistent symptoms, can check CK      Relevant Medications   DULoxetine (CYMBALTA) 30 MG capsule    Meds ordered this encounter  Medications   DULoxetine (CYMBALTA) 30 MG capsule    Sig: Take 1 capsule (30 mg total) by mouth daily.    Dispense:  30 capsule    Refill:  2    Follow-up: Return in about 3 months (around 02/20/2023) for Fatigue and myalgias.    Anabel Halon, MD

## 2022-11-20 NOTE — Assessment & Plan Note (Signed)
She has snoring, daytime fatigue and history of HTN STOP-BANG: 5 At high risk for moderate to severe OSA Check home sleep study

## 2022-11-20 NOTE — Assessment & Plan Note (Signed)
OSA can lead to inadequate sleep, can lead to myalgias Questionable fibromyalgia -added Cymbalta, can help with myalgias as well Advised to take magnesium and vitamin B12 supplements Check CBC, TSH and vitamin B-12 levels If persistent symptoms, can check CK

## 2022-11-20 NOTE — Assessment & Plan Note (Signed)
    11/20/2022    8:09 AM 07/21/2022    3:51 PM 01/26/2022   11:13 AM 12/30/2021    3:32 PM  GAD 7 : Generalized Anxiety Score  Nervous, Anxious, on Edge 2 1 3 1   Control/stop worrying 1 1 0 1  Worry too much - different things 0 2 0 1  Trouble relaxing 0 0 0 1  Restless 0 0 0 0  Easily annoyed or irritable 2 1 1 1   Afraid - awful might happen 0 0 0 0  Total GAD 7 Score 5 5 4 5   Anxiety Difficulty Not difficult at all Somewhat difficult Somewhat difficult    Did not continue Celexa, discontinued Started Cymbalta 30 mg QD - plan to increase dose as tolerated Has work related stress Prefers to try relaxation techniques, material provided Continue BH therapy

## 2022-11-23 ENCOUNTER — Other Ambulatory Visit: Payer: Self-pay | Admitting: Internal Medicine

## 2022-11-23 DIAGNOSIS — Z1231 Encounter for screening mammogram for malignant neoplasm of breast: Secondary | ICD-10-CM

## 2022-12-03 LAB — LAB REPORT - SCANNED: EGFR: 108

## 2022-12-14 ENCOUNTER — Encounter: Payer: Self-pay | Admitting: Internal Medicine

## 2022-12-25 ENCOUNTER — Ambulatory Visit: Payer: Commercial Managed Care - PPO

## 2022-12-25 ENCOUNTER — Ambulatory Visit
Admission: RE | Admit: 2022-12-25 | Discharge: 2022-12-25 | Disposition: A | Discharge status: Home / Self Care | Payer: Commercial Managed Care - PPO | Source: Ambulatory Visit | Attending: Internal Medicine

## 2022-12-25 DIAGNOSIS — Z1231 Encounter for screening mammogram for malignant neoplasm of breast: Secondary | ICD-10-CM

## 2023-01-05 DIAGNOSIS — Z0279 Encounter for issue of other medical certificate: Secondary | ICD-10-CM

## 2023-02-06 ENCOUNTER — Other Ambulatory Visit: Payer: Self-pay | Admitting: Internal Medicine

## 2023-02-06 DIAGNOSIS — I10 Essential (primary) hypertension: Secondary | ICD-10-CM

## 2023-02-09 ENCOUNTER — Ambulatory Visit (INDEPENDENT_AMBULATORY_CARE_PROVIDER_SITE_OTHER): Payer: Commercial Managed Care - PPO | Admitting: Family Medicine

## 2023-02-09 ENCOUNTER — Encounter: Payer: Self-pay | Admitting: Family Medicine

## 2023-02-09 DIAGNOSIS — B349 Viral infection, unspecified: Secondary | ICD-10-CM | POA: Diagnosis not present

## 2023-02-09 DIAGNOSIS — I10 Essential (primary) hypertension: Secondary | ICD-10-CM

## 2023-02-09 MED ORDER — PREDNISONE 20 MG PO TABS: 20.0000 mg | ORAL_TABLET | Freq: Two times a day (BID) | ORAL | 0 refills | Status: AC

## 2023-02-09 MED ORDER — BENZONATATE 200 MG PO CAPS: 200.0000 mg | ORAL_CAPSULE | Freq: Two times a day (BID) | ORAL | 0 refills | Status: DC | PRN

## 2023-02-09 MED ORDER — HYDROCHLOROTHIAZIDE 12.5 MG PO CAPS: 12.5000 mg | ORAL_CAPSULE | Freq: Every day | ORAL | 0 refills | Status: DC

## 2023-02-09 MED ORDER — AZELASTINE-FLUTICASONE 137-50 MCG/ACT NA SUSP: 1.0000 | Freq: Two times a day (BID) | NASAL | 1 refills | Status: AC

## 2023-02-09 NOTE — Assessment & Plan Note (Signed)
COVID-19, Flu A+B and RSV ordered Prednisone 20 mg twice day x 5 days , Benzonatate 200 mg PRN, Azelastine-fluticasone nasal spray PRN,     Advise patient to rest to support your body's recovery. Stay hydrated by drinking water, tea, or broth. Using a humidifier can help soothe throat irritation and ease nasal congestion. For fever or pain, acetaminophen (Tylenol) is recommended. To relieve other symptoms, try saline nasal sprays, throat lozenges, or gargling with saltwater. Focus on eating light, healthy meals like fruits and vegetables to keep your strength up. Practice good hygiene by washing your hands frequently and covering your mouth when coughing or sneezing.Follow-up for worsening or persistent symptoms. Patient verbalizes understanding regarding plan of care and all questions answered

## 2023-02-09 NOTE — Progress Notes (Signed)
Established Patient Office Visit   Subjective  Patient ID: Regina Castillo, female    DOB: [birth year]  Age: 52 y.o. MRN: [id number]  Chief Complaint  Patient presents with   Nasal Congestion    Pt reports congestion stuffy nose requesting to be seen in office. Sx started on Sunday, has a cough and head congestion.    Medication Refill    Pt also needing refills on her fluid pills.     She  has a past medical history of Cyst of ovary, left, Fatty liver, Hypertension, and Irritable bowel syndrome.  The patient presents for evaluation of a cough, describing symptoms that include cough with sputum production, rhinorrhea, post nasal drip, and chest congestion. Symptoms began two days ago and have progressively worsened. The patient denies experiencing shortness of breath, chest pain, nausea, or vomiting. Current treatment includes over-the-counter analgesics/antipyretics and an antitussive, both of which have provided minimal relief. The patient's pulmonary history with no prior episodes of pneumonia or bronchitis    Review of Systems  Constitutional:  Negative for chills and fever.  HENT:  Positive for congestion. Negative for sore throat.   Respiratory:  Positive for cough and sputum production. Negative for shortness of breath.   Cardiovascular:  Negative for chest pain.  Genitourinary:  Negative for dysuria.  Neurological:  Negative for dizziness and headaches.      Objective:     BP 128/86   Pulse 99   Ht 5' (1.524 m)   Wt 146 lb 1.9 oz (66.3 kg)   SpO2 97%   BMI 28.54 kg/m  BP Readings from Last 3 Encounters:  02/09/23 128/86  11/20/22 138/84  07/21/22 134/86      Physical Exam Vitals reviewed.  Constitutional:      General: She is not in acute distress.    Appearance: Normal appearance. She is not ill-appearing, toxic-appearing or diaphoretic.  HENT:     Head: Normocephalic.     Right Ear: Tympanic membrane normal.     Left Ear: Tympanic membrane normal.      Nose: Congestion and rhinorrhea present.     Mouth/Throat:     Pharynx: Posterior oropharyngeal erythema present.  Eyes:     General:        Right eye: No discharge.        Left eye: No discharge.     Conjunctiva/sclera: Conjunctivae normal.  Cardiovascular:     Rate and Rhythm: Normal rate.     Pulses: Normal pulses.     Heart sounds: Normal heart sounds.  Pulmonary:     Effort: Pulmonary effort is normal. No respiratory distress.     Breath sounds: Normal breath sounds.  Musculoskeletal:        General: Normal range of motion.     Cervical back: Normal range of motion.  Lymphadenopathy:     Cervical: Cervical adenopathy present.  Skin:    General: Skin is warm and dry.     Capillary Refill: Capillary refill takes less than 2 seconds.  Neurological:     Mental Status: She is alert.  Psychiatric:        Mood and Affect: Mood normal.      No results found for any visits on 02/09/23.  The 10-year ASCVD risk score (Arnett DK, et al., 2019) is: 4.1%    Assessment & Plan:  Primary hypertension -     hydroCHLOROthiazide; Take 1 capsule (12.5 mg total) by mouth daily.  Dispense: 90 capsule; Refill: 0  Viral illness Assessment & Plan: COVID-19, Flu A+B and RSV ordered Prednisone 20 mg twice day x 5 days , Benzonatate 200 mg PRN, Azelastine-fluticasone nasal spray PRN,     Advise patient to rest to support your body's recovery. Stay hydrated by drinking water, tea, or broth. Using a humidifier can help soothe throat irritation and ease nasal congestion. For fever or pain, acetaminophen (Tylenol) is recommended. To relieve other symptoms, try saline nasal sprays, throat lozenges, or gargling with saltwater. Focus on eating light, healthy meals like fruits and vegetables to keep your strength up. Practice good hygiene by washing your hands frequently and covering your mouth when coughing or sneezing.Follow-up for worsening or persistent symptoms. Patient verbalizes understanding  regarding plan of care and all questions answered    Orders: -     COVID-19, Flu A+B and RSV -     predniSONE; Take 1 tablet (20 mg total) by mouth 2 (two) times daily with a meal for 5 days.  Dispense: 10 tablet; Refill: 0 -     Benzonatate; Take 1 capsule (200 mg total) by mouth 2 (two) times daily as needed for cough.  Dispense: 20 capsule; Refill: 0 -     Azelastine-Fluticasone; Place 1 spray into the nose every 12 (twelve) hours.  Dispense: 23 g; Refill: 1    Return if symptoms worsen or fail to improve.   Cruzita Lederer Newman Nip, FNP

## 2023-02-09 NOTE — Patient Instructions (Signed)

## 2023-02-11 LAB — COVID-19, FLU A+B AND RSV
Influenza A, NAA: NOT DETECTED
Influenza B, NAA: NOT DETECTED
RSV, NAA: NOT DETECTED
SARS-CoV-2, NAA: NOT DETECTED

## 2023-02-23 ENCOUNTER — Ambulatory Visit (INDEPENDENT_AMBULATORY_CARE_PROVIDER_SITE_OTHER): Payer: Commercial Managed Care - PPO | Admitting: Internal Medicine

## 2023-02-23 ENCOUNTER — Encounter: Payer: Self-pay | Admitting: Internal Medicine

## 2023-02-23 VITALS — BP 138/86 | HR 93 | Ht 60.0 in | Wt 151.4 lb

## 2023-02-23 DIAGNOSIS — I10 Essential (primary) hypertension: Secondary | ICD-10-CM

## 2023-02-23 DIAGNOSIS — R5382 Chronic fatigue, unspecified: Secondary | ICD-10-CM

## 2023-02-23 DIAGNOSIS — M791 Myalgia, unspecified site: Secondary | ICD-10-CM | POA: Diagnosis not present

## 2023-02-23 DIAGNOSIS — F419 Anxiety disorder, unspecified: Secondary | ICD-10-CM | POA: Diagnosis not present

## 2023-02-23 MED ORDER — AMLODIPINE BESYLATE 5 MG PO TABS: 5.0000 mg | ORAL_TABLET | Freq: Every day | ORAL | 1 refills | Status: DC

## 2023-02-23 MED ORDER — DULOXETINE HCL 30 MG PO CPEP: 30.0000 mg | ORAL_CAPSULE | Freq: Every day | ORAL | 3 refills | Status: DC

## 2023-02-23 NOTE — Assessment & Plan Note (Addendum)
Could be multifactorial Her work related stress/GAD is mostly contributing to her fatigue She has hot flashes from menopause, but less likely to be contributing to her chronic fatigue-since she currently smokes, not a candidate for HRT OSA can be contributing to fatigue, check home sleep study Checked CMP and TSH Advised to start taking magnesium supplement, can also help with leg cramps and take vitamin B12 supplement Questionable fibromyalgia -added Cymbalta, can help with myalgias as well - needs to start taking it

## 2023-02-23 NOTE — Assessment & Plan Note (Addendum)
BP Readings from Last 1 Encounters:  02/23/23 138/86   Usually well-controlled with Amlodipine 5 mg QD and HCTZ 12.5 mg QD Counseled for compliance with the medications Advised DASH diet and moderate exercise/walking, at least 150 mins/week

## 2023-02-23 NOTE — Assessment & Plan Note (Addendum)
Did not continue Celexa, discontinued Started Cymbalta 30 mg once daily, but did not start it - can be helpful for fatigue/myalgias as well Has work related stress Prefers to try relaxation techniques, material provided Continue BH therapy at workplace [facility] to [facility] [place] as per patient request

## 2023-02-23 NOTE — Progress Notes (Signed)
Established Patient Office Visit  Subjective:  Patient ID: Regina Castillo, female    DOB: 1970/12/10  Age: 52 y.o. MRN: [id number]  CC:  Chief Complaint  Patient presents with   Fatigue    Three month follow up     HPI Regina Castillo is a 52 y.o. female with past medical history of hypertension, GERD, GAD and chronic neck pain who presents for follow-up of her chronic medical conditions.  HTN: BP is well-controlled. Takes medications regularly. Patient denies headache, chest pain, dyspnea or palpitations.  Chronic fatigue and myalgias: She reports daytime fatigue and muscle aches in bilateral upper extremities.  Denies any recent injury.  She reports having snoring and frequent nightmares, but has not had sleep study yet - was ordered in the last visit, but she prefers to wait for now.  Denies any active bleeding.  She attributes her fatigue to her work related stress in addition to menopause.  She also reports muscle cramps in the legs when she tries to do stretching.  GAD: She has work related stress/anxiety. Her workload has been more recently.  She has had spells of anxiety in the past, which has required a break from work.  She has also done [facility] therapy.  She was given Celexa in the past, but has not started taking it yet. She has tried relaxation techniques for it. FMLA paperwork has been filled for severe anxiety.   Past Medical History:  Diagnosis Date   Cyst of ovary, left    Fatty liver    Hypertension    Irritable bowel syndrome    Never fully evaluated for this.    Past Surgical History:  Procedure Laterality Date   ABDOMINAL HYSTERECTOMY     CESAREAN SECTION      Family History  Problem Relation Age of Onset   Diabetes Father    Diabetes Mother    Hypertension Mother    Colon cancer Neg Hx     Social History   Socioeconomic History   Marital status: Single    Spouse name: Not on file   Number of children: 3   Years of education: Not on file    Highest education level: Not on file  Occupational History   Occupation: [employer]  Tobacco Use   Smoking status: Some Days    Current packs/day: 0.25    Average packs/day: 0.3 packs/day for 15.0 years (3.8 ttl pk-yrs)    Types: Cigarettes   Smokeless tobacco: Never  Vaping Use   Vaping status: Never Used  Substance and Sexual Activity   Alcohol use: Yes    Comment: occasionally   Drug use: No   Sexual activity: Not on file    Comment: hyst  Other Topics Concern   Not on file  Social History Narrative   Works for [employer]. Single, not dating. Live with mother. Has grown children, 3 boys.  Eats meat fruits and vegetables.  Wears seatbelt.  Currently lives with mother.   Social Determinants of Health   Financial Resource Strain: Low Risk  (08/13/2021)   Overall Financial Resource Strain (CARDIA)    Difficulty of Paying Living Expenses: Not hard at all  Food Insecurity: No Food Insecurity (12/30/2021)   Hunger Vital Sign    Worried About Running Out of Food in the Last Year: Never true    Ran Out of Food in the Last Year: Never true  Transportation Needs: No Transportation Needs (12/30/2021)   PRAPARE - Transportation  Lack of Transportation (Medical): No    Lack of Transportation (Non-Medical): No  Physical Activity: Inactive (12/30/2021)   Exercise Vital Sign    Days of Exercise per Week: 0 days    Minutes of Exercise per Session: 0 min  Stress: Stress Concern Present (12/30/2021)   [employer] of Occupational Health - Occupational Stress Questionnaire    Feeling of Stress : To some extent  Social Connections: Moderately Isolated (12/30/2021)   Social Connection and Isolation Panel [NHANES]    Frequency of Communication with Friends and Family: More than three times a week    Frequency of Social Gatherings with Friends and Family: Once a week    Attends Religious Services: More than 4 times per year    Active Member of [employer] or Organizations: No    Attends Banker  Meetings: Never    Marital Status: Never married  Intimate Partner Violence: Not At Risk (12/30/2021)   Humiliation, Afraid, Rape, and Kick questionnaire    Fear of Current or Ex-Partner: No    Emotionally Abused: No    Physically Abused: No    Sexually Abused: No    Outpatient Medications Prior to Visit  Medication Sig Dispense Refill   Azelastine-Fluticasone 137-50 MCG/ACT SUSP Place 1 spray into the nose every 12 (twelve) hours. 23 g 1   benzonatate (TESSALON) 200 MG capsule Take 1 capsule (200 mg total) by mouth 2 (two) times daily as needed for cough. 20 capsule 0   Biotin w/ Vitamins C & E (HAIR/SKIN/NAILS PO) Take by mouth.     ELDERBERRY PO Take by mouth.     Fexofenadine HCl (ALLEGRA PO) Take by mouth.     hydrochlorothiazide (MICROZIDE) 12.5 MG capsule Take 1 capsule (12.5 mg total) by mouth daily. 90 capsule 0   omeprazole (PRILOSEC) 40 MG capsule Take 1 capsule (40 mg total) by mouth daily. 90 capsule 1   triamcinolone cream (KENALOG) 0.1 % Apply 1 Application topically 2 (two) times daily. 30 g 0   amLODipine (NORVASC) 5 MG tablet Take 1 tablet (5 mg total) by mouth daily. 90 tablet 1   DULoxetine (CYMBALTA) 30 MG capsule Take 1 capsule (30 mg total) by mouth daily. 30 capsule 2   No facility-administered medications prior to visit.    No Known Allergies  ROS Review of Systems  Constitutional:  Positive for fatigue. Negative for chills and fever.  HENT:  Negative for congestion, postnasal drip and sinus pressure.   Eyes:  Negative for pain and discharge.  Respiratory:  Negative for cough and shortness of breath.   Cardiovascular:  Negative for chest pain and palpitations.  Gastrointestinal:  Negative for abdominal pain, diarrhea, nausea and vomiting.  Endocrine: Negative for polydipsia and polyuria.  Genitourinary:  Negative for dysuria and hematuria.  Musculoskeletal:  Positive for back pain, myalgias and neck pain. Negative for neck stiffness.       B/l wrist pain   Skin:  Positive for rash.  Neurological:  Negative for dizziness, syncope, weakness and numbness.  Psychiatric/Behavioral:  Positive for sleep disturbance. Negative for agitation and behavioral problems. The patient is nervous/anxious.       Objective:    Physical Exam Vitals reviewed.  Constitutional:      General: She is not in acute distress.    Appearance: She is not diaphoretic.  HENT:     Head: Normocephalic and atraumatic.     Nose: No congestion.     Mouth/Throat:     Mouth:  Mucous membranes are moist.  Eyes:     General: No scleral icterus.    Extraocular Movements: Extraocular movements intact.  Cardiovascular:     Rate and Rhythm: Normal rate and regular rhythm.     Pulses: Normal pulses.     Heart sounds: Normal heart sounds. No murmur heard. Pulmonary:     Breath sounds: Normal breath sounds. No wheezing or rales.  Musculoskeletal:     Cervical back: Neck supple. No tenderness.     Right lower leg: No edema.     Left lower leg: No edema.  Skin:    General: Skin is warm.     Findings: No rash.  Neurological:     General: No focal deficit present.     Mental Status: She is alert and oriented to person, place, and time.  Psychiatric:        Mood and Affect: Mood is anxious. Affect is labile.        Behavior: Behavior normal.     BP 138/86 (BP Location: Left Arm)   Pulse 93   Ht 5' (1.524 m)   Wt 151 lb 6.4 oz (68.7 kg)   SpO2 96%   BMI 29.57 kg/m  Wt Readings from Last 3 Encounters:  02/23/23 151 lb 6.4 oz (68.7 kg)  02/09/23 146 lb 1.9 oz (66.3 kg)  11/20/22 148 lb 6.4 oz (67.3 kg)    Lab Results  Component Value Date   TSH 0.930 01/17/2021   Lab Results  Component Value Date   WBC 5.5 01/17/2021   HGB 13.6 01/17/2021   HCT 38.0 01/17/2021   MCV 88 01/17/2021   PLT 426 01/17/2021   Lab Results  Component Value Date   NA 138 01/17/2021   K 3.5 01/17/2021   CO2 27 01/17/2021   GLUCOSE 75 01/17/2021   BUN 9 01/17/2021   CREATININE  0.62 01/17/2021   BILITOT 0.3 01/17/2021   ALKPHOS 78 01/17/2021   AST 26 01/17/2021   ALT 15 01/17/2021   PROT 7.6 01/17/2021   ALBUMIN 4.6 01/17/2021   CALCIUM 9.5 01/17/2021   EGFR 108.0 12/02/2022   Lab Results  Component Value Date   CHOL 206 (H) 01/17/2021   Lab Results  Component Value Date   HDL 97 01/17/2021   Lab Results  Component Value Date   LDLCALC 89 01/17/2021   Lab Results  Component Value Date   TRIG 115 01/17/2021   Lab Results  Component Value Date   CHOLHDL 2.1 01/17/2021   No results found for: "HGBA1C"    Assessment & Plan:   Problem List Items Addressed This Visit       Cardiovascular and Mediastinum   Primary hypertension - Primary    BP Readings from Last 1 Encounters:  02/23/23 138/86   Usually well-controlled with Amlodipine 5 mg QD and HCTZ 12.5 mg QD Counseled for compliance with the medications Advised DASH diet and moderate exercise/walking, at least 150 mins/week      Relevant Medications   amLODipine (NORVASC) 5 MG tablet     Other   Anxiety (Chronic)    Did not continue Celexa, discontinued Started Cymbalta 30 mg once daily, but did not start it - can be helpful for fatigue/myalgias as well Has work related stress Prefers to try relaxation techniques, material provided Continue BH therapy at workplace [facility] to [facility] [place] as per patient request      Relevant Medications   DULoxetine (CYMBALTA) 30 MG capsule  Other Relevant Orders   Ambulatory referral to Behavioral Health   Chronic fatigue    Could be multifactorial Her work related stress/GAD is mostly contributing to her fatigue She has hot flashes from menopause, but less likely to be contributing to her chronic fatigue-since she currently smokes, not a candidate for HRT OSA can be contributing to fatigue, check home sleep study Checked CMP and TSH Advised to start taking magnesium supplement, can also help with leg cramps and take vitamin B12  supplement Questionable fibromyalgia -added Cymbalta, can help with myalgias as well - needs to start taking it      Myalgia    OSA can lead to inadequate sleep, can lead to myalgias Questionable fibromyalgia -added Cymbalta, can help with myalgias as well Advised to take magnesium and vitamin B12 supplements Checked CBC, TSH and vitamin B-12 levels If persistent symptoms, can check CK      Relevant Medications   DULoxetine (CYMBALTA) 30 MG capsule     Meds ordered this encounter  Medications   DULoxetine (CYMBALTA) 30 MG capsule    Sig: Take 1 capsule (30 mg total) by mouth daily.    Dispense:  30 capsule    Refill:  3   amLODipine (NORVASC) 5 MG tablet    Sig: Take 1 tablet (5 mg total) by mouth daily.    Dispense:  90 tablet    Refill:  1    Follow-up: Return in about 4 months (around 06/23/2023) for GAD and HTN.    Anabel Halon, MD

## 2023-02-23 NOTE — Patient Instructions (Signed)
Please start taking Duloxetine 30 mg once daily.  Please continue to take other medications as prescribed.  Please continue to follow low carb diet and perform moderate exercise/walking at least 150 mins/week.

## 2023-02-23 NOTE — Assessment & Plan Note (Signed)
OSA can lead to inadequate sleep, can lead to myalgias Questionable fibromyalgia -added Cymbalta, can help with myalgias as well Advised to take magnesium and vitamin B12 supplements Checked CBC, TSH and vitamin B-12 levels If persistent symptoms, can check CK

## 2023-03-23 ENCOUNTER — Telehealth: Payer: Self-pay | Admitting: Internal Medicine

## 2023-03-23 NOTE — Telephone Encounter (Signed)
Med approved 

## 2023-06-07 ENCOUNTER — Other Ambulatory Visit: Payer: Self-pay | Admitting: Internal Medicine

## 2023-06-07 DIAGNOSIS — K219 Gastro-esophageal reflux disease without esophagitis: Secondary | ICD-10-CM

## 2023-06-23 ENCOUNTER — Encounter: Payer: Self-pay | Admitting: Internal Medicine

## 2023-06-23 ENCOUNTER — Ambulatory Visit (INDEPENDENT_AMBULATORY_CARE_PROVIDER_SITE_OTHER): Payer: Commercial Managed Care - PPO | Admitting: Internal Medicine

## 2023-06-23 VITALS — BP 128/82 | HR 92 | Ht 60.0 in | Wt 155.4 lb

## 2023-06-23 DIAGNOSIS — E538 Deficiency of other specified B group vitamins: Secondary | ICD-10-CM | POA: Diagnosis not present

## 2023-06-23 DIAGNOSIS — R739 Hyperglycemia, unspecified: Secondary | ICD-10-CM

## 2023-06-23 DIAGNOSIS — F419 Anxiety disorder, unspecified: Secondary | ICD-10-CM | POA: Diagnosis not present

## 2023-06-23 DIAGNOSIS — M25561 Pain in right knee: Secondary | ICD-10-CM

## 2023-06-23 DIAGNOSIS — E559 Vitamin D deficiency, unspecified: Secondary | ICD-10-CM

## 2023-06-23 DIAGNOSIS — E782 Mixed hyperlipidemia: Secondary | ICD-10-CM

## 2023-06-23 DIAGNOSIS — I10 Essential (primary) hypertension: Secondary | ICD-10-CM | POA: Diagnosis not present

## 2023-06-23 DIAGNOSIS — M5030 Other cervical disc degeneration, unspecified cervical region: Secondary | ICD-10-CM | POA: Diagnosis not present

## 2023-06-23 DIAGNOSIS — G8929 Other chronic pain: Secondary | ICD-10-CM | POA: Insufficient documentation

## 2023-06-23 MED ORDER — CYANOCOBALAMIN 1000 MCG/ML IJ SOLN
1000.0000 ug | Freq: Once | INTRAMUSCULAR | Status: AC
  Administered 2023-06-23: 1000 ug via INTRAMUSCULAR

## 2023-06-23 MED ORDER — AMLODIPINE BESYLATE 5 MG PO TABS: 5.0000 mg | ORAL_TABLET | Freq: Every day | ORAL | 1 refills | Status: DC

## 2023-06-23 MED ORDER — DICLOFENAC SODIUM 1 % EX GEL: 4.0000 g | Freq: Four times a day (QID) | CUTANEOUS | 1 refills | Status: AC

## 2023-06-23 MED ORDER — CYCLOBENZAPRINE HCL 5 MG PO TABS: 5.0000 mg | ORAL_TABLET | Freq: Two times a day (BID) | ORAL | 1 refills | Status: AC | PRN

## 2023-06-23 MED ORDER — HYDROCHLOROTHIAZIDE 12.5 MG PO CAPS: 12.5000 mg | ORAL_CAPSULE | Freq: Every day | ORAL | 1 refills | Status: DC

## 2023-06-23 NOTE — Assessment & Plan Note (Signed)
 Has borderline low B12 level Has chronic fatigue B12 injection today Advised to take Vitamin B12 500 mcg once daily

## 2023-06-23 NOTE — Assessment & Plan Note (Addendum)
 Did not continue Celexa, discontinued Started Cymbalta 30 mg once daily, but did not start it - can be helpful for fatigue/myalgias as well, but she prefers to avoid any medicine for now Has work related stress Prefers to try relaxation techniques, material provided Continue BH therapy at workplace Ref[facility] to [facility] [place] as per patient request, but did not get any call

## 2023-06-23 NOTE — Progress Notes (Signed)
 Established Patient Office Visit  Subjective:  Patient ID: Regina Castillo, female    DOB: [birth year]  Age: 53 y.o. MRN: [id number]  CC:  Chief Complaint  Patient presents with   Care Management    4 Month f/u, reports knee pain still ongoing. Would like a refill on medication to help with neck pain, does not recall the name.     HPI Regina Castillo is a 53 y.o. female with past medical history of hypertension, GERD, GAD and chronic neck pain who presents for follow-up of her chronic medical conditions.  HTN: BP is well-controlled. Takes medications regularly. Patient denies headache, chest pain, dyspnea or palpitations.  Chronic fatigue and myalgias: She reports daytime fatigue and muscle aches in bilateral upper extremities.  Denies any recent injury.  She reported having snoring and frequent nightmares, but has not had sleep study yet - was ordered in the past, but she prefers to wait for now.  Denies any active bleeding.  She attributes her fatigue to her work related stress in addition to menopause.  She also reports muscle cramps in the legs when she tries to do stretching.  GAD: She has work related stress/anxiety. Her workload has been more recently.  She has had spells of anxiety in the past, which has required a break from work.  She has also done [facility] therapy.  She was given Celexa in the past, but has not started taking it yet. She did not try Cymbalta as well. She has tried relaxation techniques for it. FMLA paperwork has been filled for severe anxiety.  She reports chronic neck pain, and suprascapular area pain, which is chronic, dull and intermittent.  She had responded well to Flexeril in the past.  She also reports chronic right knee pain, worse with extension and better with rest.  She has also tried using knee brace, which has improved pain up to certain extent.  She takes Advil as needed for it.  Denies any numbness or tingling of the LE.   Past Medical History:   Diagnosis Date   Cyst of ovary, left    Fatty liver    Hypertension    Irritable bowel syndrome    Never fully evaluated for this.    Past Surgical History:  Procedure Laterality Date   ABDOMINAL HYSTERECTOMY     CESAREAN SECTION      Family History  Problem Relation Age of Onset   Diabetes Father    Diabetes Mother    Hypertension Mother    Colon cancer Neg Hx     Social History   Socioeconomic History   Marital status: Single    Spouse name: Not on file   Number of children: 3   Years of education: Not on file   Highest education level: Not on file  Occupational History   Occupation: [employer]  Tobacco Use   Smoking status: Some Days    Current packs/day: 0.25    Average packs/day: 0.3 packs/day for 15.0 years (3.8 ttl pk-yrs)    Types: Cigarettes   Smokeless tobacco: Never  Vaping Use   Vaping status: Never Used  Substance and Sexual Activity   Alcohol use: Yes    Comment: occasionally   Drug use: No   Sexual activity: Not on file    Comment: hyst  Other Topics Concern   Not on file  Social History Narrative   Works for [employer]. Single, not dating. Live with mother. Has grown children, 3 boys.  Eats  meat fruits and vegetables.  Wears seatbelt.  Currently lives with mother.   Social Drivers of Corporate investment banker Strain: Low Risk  (08/13/2021)   Overall Financial Resource Strain (CARDIA)    Difficulty of Paying Living Expenses: Not hard at all  Food Insecurity: No Food Insecurity (12/30/2021)   Hunger Vital Sign    Worried About Running Out of Food in the Last Year: Never true    Ran Out of Food in the Last Year: Never true  Transportation Needs: No Transportation Needs (12/30/2021)   PRAPARE - Administrator, Civil Service (Medical): No    Lack of Transportation (Non-Medical): No  Physical Activity: Inactive (12/30/2021)   Exercise Vital Sign    Days of Exercise per Week: 0 days    Minutes of Exercise per Session: 0 min  Stress: Stress  Concern Present (12/30/2021)   [employer] of Occupational Health - Occupational Stress Questionnaire    Feeling of Stress : To some extent  Social Connections: Moderately Isolated (12/30/2021)   Social Connection and Isolation Panel [NHANES]    Frequency of Communication with Friends and Family: More than three times a week    Frequency of Social Gatherings with Friends and Family: Once a week    Attends Religious Services: More than 4 times per year    Active Member of [employer] or Organizations: No    Attends Banker Meetings: Never    Marital Status: Never married  Intimate Partner Violence: Not At Risk (12/30/2021)   Humiliation, Afraid, Rape, and Kick questionnaire    Fear of Current or Ex-Partner: No    Emotionally Abused: No    Physically Abused: No    Sexually Abused: No    Outpatient Medications Prior to Visit  Medication Sig Dispense Refill   Biotin w/ Vitamins C & E (HAIR/SKIN/NAILS PO) Take by mouth.     ELDERBERRY PO Take by mouth.     Fexofenadine HCl (ALLEGRA PO) Take by mouth.     omeprazole (PRILOSEC) 40 MG capsule Take 1 capsule by mouth once daily 90 capsule 0   triamcinolone cream (KENALOG) 0.1 % Apply 1 Application topically 2 (two) times daily. 30 g 0   amLODipine (NORVASC) 5 MG tablet Take 1 tablet (5 mg total) by mouth daily. 90 tablet 1   benzonatate (TESSALON) 200 MG capsule Take 1 capsule (200 mg total) by mouth 2 (two) times daily as needed for cough. 20 capsule 0   hydrochlorothiazide (MICROZIDE) 12.5 MG capsule Take 1 capsule (12.5 mg total) by mouth daily. 90 capsule 0   Azelastine-Fluticasone 137-50 MCG/ACT SUSP Place 1 spray into the nose every 12 (twelve) hours. (Patient not taking: Reported on 06/23/2023) 23 g 1   DULoxetine (CYMBALTA) 30 MG capsule Take 1 capsule (30 mg total) by mouth daily. (Patient not taking: Reported on 06/23/2023) 30 capsule 3   No facility-administered medications prior to visit.    No Known  Allergies  ROS Review of Systems  Constitutional:  Positive for fatigue. Negative for chills and fever.  HENT:  Negative for congestion, postnasal drip and sinus pressure.   Eyes:  Negative for pain and discharge.  Respiratory:  Negative for cough and shortness of breath.   Cardiovascular:  Negative for chest pain and palpitations.  Gastrointestinal:  Negative for abdominal pain, diarrhea, nausea and vomiting.  Endocrine: Negative for polydipsia and polyuria.  Genitourinary:  Negative for dysuria and hematuria.  Musculoskeletal:  Positive for arthralgias (Right knee), back  pain, myalgias and neck pain. Negative for neck stiffness.       B/l wrist pain  Skin:  Positive for rash.  Neurological:  Negative for dizziness, syncope, weakness and numbness.  Psychiatric/Behavioral:  Positive for sleep disturbance. Negative for agitation and behavioral problems. The patient is nervous/anxious.       Objective:    Physical Exam Vitals reviewed.  Constitutional:      General: She is not in acute distress.    Appearance: She is not diaphoretic.  HENT:     Head: Normocephalic and atraumatic.     Nose: No congestion.     Mouth/Throat:     Mouth: Mucous membranes are moist.  Eyes:     General: No scleral icterus.    Extraocular Movements: Extraocular movements intact.  Cardiovascular:     Rate and Rhythm: Normal rate and regular rhythm.     Pulses: Normal pulses.     Heart sounds: Normal heart sounds. No murmur heard. Pulmonary:     Breath sounds: Normal breath sounds. No wheezing or rales.  Musculoskeletal:     Cervical back: Neck supple. Tenderness (Cervical and bilateral suprascapular area) present.     Right knee: Crepitus present. No swelling. Tenderness present.     Right lower leg: No edema.     Left lower leg: No edema.  Skin:    General: Skin is warm.     Findings: No rash.  Neurological:     General: No focal deficit present.     Mental Status: She is alert and oriented  to person, place, and time.  Psychiatric:        Mood and Affect: Mood is anxious. Affect is labile.        Behavior: Behavior normal.     BP 128/82 (BP Location: Left Arm)   Pulse 92   Ht 5' (1.524 m)   Wt 155 lb 6.4 oz (70.5 kg)   SpO2 98%   BMI 30.35 kg/m  Wt Readings from Last 3 Encounters:  06/23/23 155 lb 6.4 oz (70.5 kg)  02/23/23 151 lb 6.4 oz (68.7 kg)  02/09/23 146 lb 1.9 oz (66.3 kg)    Lab Results  Component Value Date   TSH 0.930 01/17/2021   Lab Results  Component Value Date   WBC 5.5 01/17/2021   HGB 13.6 01/17/2021   HCT 38.0 01/17/2021   MCV 88 01/17/2021   PLT 426 01/17/2021   Lab Results  Component Value Date   NA 138 01/17/2021   K 3.5 01/17/2021   CO2 27 01/17/2021   GLUCOSE 75 01/17/2021   BUN 9 01/17/2021   CREATININE 0.62 01/17/2021   BILITOT 0.3 01/17/2021   ALKPHOS 78 01/17/2021   AST 26 01/17/2021   ALT 15 01/17/2021   PROT 7.6 01/17/2021   ALBUMIN 4.6 01/17/2021   CALCIUM 9.5 01/17/2021   EGFR 108.0 12/02/2022   Lab Results  Component Value Date   CHOL 206 (H) 01/17/2021   Lab Results  Component Value Date   HDL 97 01/17/2021   Lab Results  Component Value Date   LDLCALC 89 01/17/2021   Lab Results  Component Value Date   TRIG 115 01/17/2021   Lab Results  Component Value Date   CHOLHDL 2.1 01/17/2021   No results found for: "HGBA1C"    Assessment & Plan:   Problem List Items Addressed This Visit       Cardiovascular and Mediastinum   Primary hypertension - Primary  BP Readings from Last 1 Encounters:  06/23/23 128/82   Usually well-controlled with Amlodipine 5 mg QD and HCTZ 12.5 mg QD Counseled for compliance with the medications Advised DASH diet and moderate exercise/walking, at least 150 mins/week      Relevant Medications   amLODipine (NORVASC) 5 MG tablet   hydrochlorothiazide (MICROZIDE) 12.5 MG capsule   Other Relevant Orders   TSH   CMP14+EGFR   CBC with Differential/Platelet      Musculoskeletal and Integument   DDD (degenerative disc disease), cervical   Has seen Neurosurgeon Likely has muscular strain as well Conservative management for now Flexeril PRN      Relevant Medications   cyclobenzaprine (FLEXERIL) 5 MG tablet     Other   Anxiety (Chronic)   Did not continue Celexa, discontinued Started Cymbalta 30 mg once daily, but did not start it - can be helpful for fatigue/myalgias as well, but she prefers to avoid any medicine for now Has work related stress Prefers to try relaxation techniques, material provided Continue BH therapy at workplace Referred to [facility] [place] as per patient request, but did not get any call      Relevant Orders   TSH   CMP14+EGFR   CBC with Differential/Platelet   Chronic pain of right knee   Could be due to mild arthritis and/or partial ligament tear No recent injury or fall Continue using knee brace as needed Advil as needed for pain, can also alternate with Tylenol arthritis Voltaren gel as needed for knee pain If worsening or persistent pain, will obtain imaging      Relevant Medications   diclofenac Sodium (VOLTAREN ARTHRITIS PAIN) 1 % GEL   B12 deficiency   Has borderline low B12 level Has chronic fatigue B12 injection today Advised to take Vitamin B12 500 mcg once daily      Relevant Orders   B12   Other Visit Diagnoses       Vitamin D deficiency       Relevant Orders   VITAMIN D 25 Hydroxy (Vit-D Deficiency, Fractures)     Mixed hyperlipidemia       Relevant Medications   amLODipine (NORVASC) 5 MG tablet   hydrochlorothiazide (MICROZIDE) 12.5 MG capsule   Other Relevant Orders   Lipid panel     Hyperglycemia       Relevant Orders   Hemoglobin A1c         Meds ordered this encounter  Medications   amLODipine (NORVASC) 5 MG tablet    Sig: Take 1 tablet (5 mg total) by mouth daily.    Dispense:  90 tablet    Refill:  1   hydrochlorothiazide (MICROZIDE) 12.5 MG capsule    Sig:  Take 1 capsule (12.5 mg total) by mouth daily.    Dispense:  90 capsule    Refill:  1   cyclobenzaprine (FLEXERIL) 5 MG tablet    Sig: Take 1 tablet (5 mg total) by mouth 2 (two) times daily as needed.    Dispense:  30 tablet    Refill:  1   diclofenac Sodium (VOLTAREN ARTHRITIS PAIN) 1 % GEL    Sig: Apply 4 g topically 4 (four) times daily.    Dispense:  100 g    Refill:  1   cyanocobalamin (VITAMIN B12) injection 1,000 mcg    Follow-up: Return in about 6 months (around 12/24/2023) for HTN.    Anabel Halon, MD

## 2023-06-23 NOTE — Assessment & Plan Note (Signed)
 Has seen Neurosurgeon Likely has muscular strain as well Conservative management for now Flexeril PRN

## 2023-06-23 NOTE — Patient Instructions (Signed)
 Please start taking Vitamin B12 500 mcg once daily.  Please use Voltaren gel for knee pain.  Please continue to take medications as prescribed.  Please continue to follow low carb diet and perform moderate exercise/walking at least 150 mins/week.  Please get fasting blood tests done before the next visit.

## 2023-06-23 NOTE — Assessment & Plan Note (Addendum)
 BP Readings from Last 1 Encounters:  06/23/23 128/82   Usually well-controlled with Amlodipine 5 mg QD and HCTZ 12.5 mg QD Counseled for compliance with the medications Advised DASH diet and moderate exercise/walking, at least 150 mins/week

## 2023-06-23 NOTE — Addendum Note (Signed)
 Addended byTrena Platt on: 06/23/2023 01:43 PM   Modules accepted: Orders

## 2023-06-23 NOTE — Assessment & Plan Note (Signed)
 Could be due to mild arthritis and/or partial ligament tear No recent injury or fall Continue using knee brace as needed Advil as needed for pain, can also alternate with Tylenol arthritis Voltaren gel as needed for knee pain If worsening or persistent pain, will obtain imaging

## 2023-07-15 ENCOUNTER — Telehealth (HOSPITAL_COMMUNITY): Payer: Self-pay

## 2023-07-15 NOTE — Telephone Encounter (Signed)
 07/19/23 appt confirmed

## 2023-07-19 ENCOUNTER — Ambulatory Visit (INDEPENDENT_AMBULATORY_CARE_PROVIDER_SITE_OTHER): Admitting: Clinical

## 2023-07-19 ENCOUNTER — Encounter (HOSPITAL_COMMUNITY): Payer: Self-pay

## 2023-07-19 DIAGNOSIS — F4323 Adjustment disorder with mixed anxiety and depressed mood: Secondary | ICD-10-CM
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
## 2023-08-14 LAB — LAB REPORT - SCANNED
A1c: 5.7
EGFR: 108

## 2023-08-23 ENCOUNTER — Ambulatory Visit (HOSPITAL_COMMUNITY): Admitting: Clinical

## 2023-08-23 DIAGNOSIS — F4323 Adjustment disorder with mixed anxiety and depressed mood: Secondary | ICD-10-CM | POA: Diagnosis not present

## 2023-08-23 NOTE — Progress Notes (Signed)
 Virtual Visit via Video Note  I connected with Regina Castillo on 08/23/23 at  4:00 PM EDT by a video enabled telemedicine application and verified that I am speaking with the correct person using two identifiers.  Location: Patient: home Provider: office   I discussed the limitations of evaluation and management by telemedicine and the availability of in person appointments. The patient expressed understanding and agreed to proceed.  THERAPIST PROGRESS NOTE   Session Time: 4:00 PM-4:55 PM   Participation Level: Active   Behavioral Response: CasualAlertDepressed/Anxious   Type of Therapy: Individual Therapy   Treatment Goals addressed: Coping to assist with  Adjustment Disorder with depressed mood and anxiety   Interventions: CBT, DBT, Solution Focused, Strength-based and Supportive   Summary: Regina Asencio. Castillo is a 53 y.o. female who presents with Adjustment Disorder with depressed mood and Anxiety. The OPT therapist worked with the patient for her OPT treatment. The OPT therapist utilized Motivational Interviewing to assist in creating therapeutic repore. 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 The patient in this session spoke about willingness to continue to be self focused on her health and making healthy choices.The OPT therapist continued to support and encourage the patient utilize her coping including going to the gym, spending time with support system, and change of environment getting out of the home more .   Suicidal/Homicidal: Nowithout intent/plan   Therapist Response: The OPT therapist worked with the patient for the patients  scheduled session. The patient was engaged in her session and gave feedback in relation to triggers, symptoms, and behavior responses over the past few weeks The patient continues to actively implement tools to improve her basic health need areas around sleep, eating habits, hygiene, and physical exercise with consistency.The OPT therapist worked with the patient utilizing an in session Cognitive Behavioral Therapy exercise. The patient was responsive in the session and verbalized, " I took a day off today that I realized I just needed for myself and I do not do that very frequently but I used my FMLA ".  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 The patient spoke about her goal of planning for retirement. The patient spoke about looking forward to upcoming Memorial Day weekend and having a extended weekend with her job being closed on Monday. The patient spoke about joining a gym and so far has found going to the gym to be a helpful decompression from her work day. The OPT therapist will continue treatment work with the patient in her next scheduled session.      Plan: Return again in 2 weeks.   Diagnosis:      Axis I: Adjustment Disorder with depressed mood and anxiety                           Axis II: No diagnosis   Collaboration of Care: No additional collaboration of care for this session.   Patient/Guardian was advised Release of Information must be obtained prior to any record release in order to collaborate their care with an outside provider. Patient/Guardian was  advised if they have not already done so to contact the registration department to sign all necessary forms in order for us  to release information regarding their care.    Consent: Patient/Guardian gives verbal consent for treatment and assignment of benefits for services provided during this visit. Patient/Guardian expressed understanding and agreed to  proceed.      I discussed the assessment and treatment plan with the patient. The patient was provided an opportunity to ask questions and all were answered. The patient agreed with the plan and demonstrated an understanding of the instructions.   The patient was advised to call back or seek an in-person evaluation if the symptoms worsen or if the condition fails to improve as anticipated.   I provided 55 minutes of non-face-to-face time during this encounter.   Lea Primmer, LCSW   08/23/2023

## 2023-09-02 ENCOUNTER — Other Ambulatory Visit: Payer: Self-pay | Admitting: Internal Medicine

## 2023-09-02 DIAGNOSIS — K219 Gastro-esophageal reflux disease without esophagitis: Secondary | ICD-10-CM

## 2023-09-21 ENCOUNTER — Ambulatory Visit (HOSPITAL_COMMUNITY): Admitting: Clinical

## 2023-11-16 ENCOUNTER — Other Ambulatory Visit: Payer: Self-pay | Admitting: Internal Medicine

## 2023-11-16 DIAGNOSIS — K219 Gastro-esophageal reflux disease without esophagitis: Secondary | ICD-10-CM

## 2023-12-24 ENCOUNTER — Other Ambulatory Visit: Payer: Self-pay | Admitting: Internal Medicine

## 2023-12-24 DIAGNOSIS — Z1231 Encounter for screening mammogram for malignant neoplasm of breast: Secondary | ICD-10-CM

## 2023-12-27 ENCOUNTER — Ambulatory Visit: Admitting: Internal Medicine

## 2024-01-06 ENCOUNTER — Ambulatory Visit (INDEPENDENT_AMBULATORY_CARE_PROVIDER_SITE_OTHER): Admitting: Internal Medicine

## 2024-01-06 ENCOUNTER — Encounter: Payer: Self-pay | Admitting: Internal Medicine

## 2024-01-06 ENCOUNTER — Encounter (INDEPENDENT_AMBULATORY_CARE_PROVIDER_SITE_OTHER): Payer: Self-pay | Admitting: *Deleted

## 2024-01-06 VITALS — BP 132/88 | HR 103 | Ht 60.0 in | Wt 150.6 lb

## 2024-01-06 DIAGNOSIS — I10 Essential (primary) hypertension: Secondary | ICD-10-CM | POA: Diagnosis not present

## 2024-01-06 DIAGNOSIS — E538 Deficiency of other specified B group vitamins: Secondary | ICD-10-CM

## 2024-01-06 DIAGNOSIS — L239 Allergic contact dermatitis, unspecified cause: Secondary | ICD-10-CM

## 2024-01-06 DIAGNOSIS — Z0001 Encounter for general adult medical examination with abnormal findings: Secondary | ICD-10-CM | POA: Diagnosis not present

## 2024-01-06 DIAGNOSIS — F419 Anxiety disorder, unspecified: Secondary | ICD-10-CM

## 2024-01-06 DIAGNOSIS — K219 Gastro-esophageal reflux disease without esophagitis: Secondary | ICD-10-CM

## 2024-01-06 DIAGNOSIS — Z1211 Encounter for screening for malignant neoplasm of colon: Secondary | ICD-10-CM

## 2024-01-06 MED ORDER — CYANOCOBALAMIN 1000 MCG/ML IJ SOLN
1000.0000 ug | Freq: Once | INTRAMUSCULAR | Status: AC
  Administered 2024-01-06: 1000 ug via INTRAMUSCULAR

## 2024-01-06 MED ORDER — TRIAMCINOLONE ACETONIDE 0.1 % EX CREA
1.0000 | TOPICAL_CREAM | Freq: Two times a day (BID) | CUTANEOUS | 0 refills | Status: AC
Start: 2024-01-06 — End: ?

## 2024-01-06 NOTE — Progress Notes (Signed)
 Established Patient Office Visit  Subjective:  Patient ID: Regina Castillo, female    DOB: April 17, 1970  Age: 53 y.o. MRN: [id number]  CC:  Chief Complaint  Patient presents with   Hypertension    6 month f/u     HPI Regina Castillo is a 53 y.o. female with past medical history of hypertension, GERD, GAD and chronic neck pain who presents for follow-up of her chronic medical conditions.  HTN: BP is well-controlled. Takes medications regularly. Patient denies headache, chest pain, dyspnea or palpitations.  Chronic fatigue and myalgias: She has chronic fatigue and muscle aches in bilateral upper extremities.  Denies any recent injury. Denies any active bleeding.  She attributes her fatigue to her work related stress in addition to menopause.  She has muscle cramps in the legs when she tries to do stretching.  GAD: She has work related stress/anxiety. Her workload has been more recently.  She has had spells of anxiety in the past, which has required a break from work.  She has also done [facility] therapy.  She was given Celexa  in the past, but did not take it. She did not try Cymbalta  as well. She has tried relaxation techniques for it. FMLA paperwork has been filled for severe anxiety.  She reports chronic neck pain, and suprascapular area pain, which is chronic, dull and intermittent.  She had responded well to Flexeril  in the past.  She has brought blood tests from her workplace, which show mild hyperlipidemia (LDL 102).   Past Medical History:  Diagnosis Date   Cyst of ovary, left    Fatty liver    Hypertension    Irritable bowel syndrome    Never fully evaluated for this.    Past Surgical History:  Procedure Laterality Date   ABDOMINAL HYSTERECTOMY     CESAREAN SECTION      Family History  Problem Relation Age of Onset   Diabetes Father    Diabetes Mother    Hypertension Mother    Colon cancer Neg Hx     Social History   Socioeconomic History   Marital status:  Single    Spouse name: Not on file   Number of children: 3   Years of education: Not on file   Highest education level: Not on file  Occupational History   Occupation: [employer]  Tobacco Use   Smoking status: Some Days    Current packs/day: 0.25    Average packs/day: 0.3 packs/day for 15.0 years (3.8 ttl pk-yrs)    Types: Cigarettes   Smokeless tobacco: Never  Vaping Use   Vaping status: Never Used  Substance and Sexual Activity   Alcohol use: Yes    Comment: occasionally   Drug use: No   Sexual activity: Not on file    Comment: hyst  Other Topics Concern   Not on file  Social History Narrative   Works for [employer]. Single, not dating. Live with mother. Has grown children, 3 boys.  Eats meat fruits and vegetables.  Wears seatbelt.  Currently lives with mother.   Social Drivers of Corporate investment banker Strain: Low Risk  (08/13/2021)   Overall Financial Resource Strain (CARDIA)    Difficulty of Paying Living Expenses: Not hard at all  Food Insecurity: No Food Insecurity (12/30/2021)   Hunger Vital Sign    Worried About Running Out of Food in the Last Year: Never true    Ran Out of Food in the Last Year: Never true  Transportation Needs: No Transportation Needs (12/30/2021)   PRAPARE - Administrator, Civil Service (Medical): No    Lack of Transportation (Non-Medical): No  Physical Activity: Inactive (12/30/2021)   Exercise Vital Sign    Days of Exercise per Week: 0 days    Minutes of Exercise per Session: 0 min  Stress: Stress Concern Present (12/30/2021)   [employer] of Occupational Health - Occupational Stress Questionnaire    Feeling of Stress : To some extent  Social Connections: Moderately Isolated (12/30/2021)   Social Connection and Isolation Panel    Frequency of Communication with Friends and Family: More than three times a week    Frequency of Social Gatherings with Friends and Family: Once a week    Attends Religious Services: More than 4 times per  year    Active Member of [employer] or Organizations: No    Attends Banker Meetings: Never    Marital Status: Never married  Intimate Partner Violence: Not At Risk (12/30/2021)   Humiliation, Afraid, Rape, and Kick questionnaire    Fear of Current or Ex-Partner: No    Emotionally Abused: No    Physically Abused: No    Sexually Abused: No    Outpatient Medications Prior to Visit  Medication Sig Dispense Refill   amLODipine  (NORVASC ) 5 MG tablet Take 1 tablet (5 mg total) by mouth daily. 90 tablet 1   Azelastine -Fluticasone  137-50 MCG/ACT SUSP Place 1 spray into the nose every 12 (twelve) hours. 23 g 1   Biotin w/ Vitamins C & E (HAIR/SKIN/NAILS PO) Take by mouth.     cyclobenzaprine  (FLEXERIL ) 5 MG tablet Take 1 tablet (5 mg total) by mouth 2 (two) times daily as needed. 30 tablet 1   diclofenac  Sodium (VOLTAREN  ARTHRITIS PAIN) 1 % GEL Apply 4 g topically 4 (four) times daily. 100 g 1   ELDERBERRY PO Take by mouth.     Fexofenadine HCl (ALLEGRA PO) Take by mouth.     hydrochlorothiazide  (MICROZIDE ) 12.5 MG capsule Take 1 capsule (12.5 mg total) by mouth daily. 90 capsule 1   omeprazole  (PRILOSEC) 40 MG capsule Take 1 capsule by mouth once daily 90 capsule 0   triamcinolone  cream (KENALOG ) 0.1 % Apply 1 Application topically 2 (two) times daily. 30 g 0   No facility-administered medications prior to visit.    No Known Allergies  ROS Review of Systems  Constitutional:  Positive for fatigue. Negative for chills and fever.  HENT:  Negative for congestion, postnasal drip and sinus pressure.   Eyes:  Negative for pain and discharge.  Respiratory:  Negative for cough and shortness of breath.   Cardiovascular:  Negative for chest pain and palpitations.  Gastrointestinal:  Negative for abdominal pain, diarrhea, nausea and vomiting.  Endocrine: Negative for polydipsia and polyuria.  Genitourinary:  Negative for dysuria and hematuria.  Musculoskeletal:  Positive for arthralgias  (Right knee), back pain, myalgias and neck pain. Negative for neck stiffness.       B/l wrist pain  Skin:  Positive for rash.  Neurological:  Negative for dizziness, syncope, weakness and numbness.  Psychiatric/Behavioral:  Positive for sleep disturbance. Negative for agitation and behavioral problems. The patient is nervous/anxious.       Objective:    Physical Exam Vitals reviewed.  Constitutional:      General: She is not in acute distress.    Appearance: She is not diaphoretic.  HENT:     Head: Normocephalic and atraumatic.  Nose: No congestion.     Mouth/Throat:     Mouth: Mucous membranes are moist.  Eyes:     General: No scleral icterus.    Extraocular Movements: Extraocular movements intact.  Cardiovascular:     Rate and Rhythm: Normal rate and regular rhythm.     Pulses: Normal pulses.     Heart sounds: Normal heart sounds. No murmur heard. Pulmonary:     Breath sounds: Normal breath sounds. No wheezing or rales.  Abdominal:     Palpations: Abdomen is soft.     Tenderness: There is no abdominal tenderness.  Musculoskeletal:     Cervical back: Neck supple. Tenderness (Cervical and bilateral suprascapular area) present.     Right knee: Crepitus present. No swelling. Tenderness present.     Right lower leg: No edema.     Left lower leg: No edema.  Skin:    General: Skin is warm.     Findings: Rash (Light brownish patches over left leg) present.  Neurological:     General: No focal deficit present.     Mental Status: She is alert and oriented to person, place, and time.     Cranial Nerves: No cranial nerve deficit.     Sensory: No sensory deficit.     Motor: No weakness.  Psychiatric:        Mood and Affect: Mood is anxious.        Behavior: Behavior normal.     BP 132/88   Pulse (!) 103   Ht 5' (1.524 m)   Wt 150 lb 9.6 oz (68.3 kg)   SpO2 98%   BMI 29.41 kg/m  Wt Readings from Last 3 Encounters:  01/06/24 150 lb 9.6 oz (68.3 kg)  06/23/23 155 lb  6.4 oz (70.5 kg)  02/23/23 151 lb 6.4 oz (68.7 kg)    Lab Results  Component Value Date   TSH 0.930 01/17/2021   Lab Results  Component Value Date   WBC 5.5 01/17/2021   HGB 13.6 01/17/2021   HCT 38.0 01/17/2021   MCV 88 01/17/2021   PLT 426 01/17/2021   Lab Results  Component Value Date   NA 138 01/17/2021   K 3.5 01/17/2021   CO2 27 01/17/2021   GLUCOSE 75 01/17/2021   BUN 9 01/17/2021   CREATININE 0.62 01/17/2021   BILITOT 0.3 01/17/2021   ALKPHOS 78 01/17/2021   AST 26 01/17/2021   ALT 15 01/17/2021   PROT 7.6 01/17/2021   ALBUMIN 4.6 01/17/2021   CALCIUM 9.5 01/17/2021   EGFR 108.0 12/02/2022   Lab Results  Component Value Date   CHOL 206 (H) 01/17/2021   Lab Results  Component Value Date   HDL 97 01/17/2021   Lab Results  Component Value Date   LDLCALC 89 01/17/2021   Lab Results  Component Value Date   TRIG 115 01/17/2021   Lab Results  Component Value Date   CHOLHDL 2.1 01/17/2021   No results found for: HGBA1C    Assessment & Plan:   Problem List Items Addressed This Visit       Cardiovascular and Mediastinum   Primary hypertension   BP Readings from Last 1 Encounters:  01/06/24 132/88   Usually well-controlled with Amlodipine  5 mg QD and HCTZ 12.5 mg QD Counseled for compliance with the medications Advised DASH diet and moderate exercise/walking, at least 150 mins/week        Digestive   GERD (gastroesophageal reflux disease)   Well controlled with  omeprazole  Takes probiotic for bloating        Musculoskeletal and Integument   Allergic contact dermatitis   Has a history of contact dermatitis Kenalog  cream refilled      Relevant Medications   triamcinolone  cream (KENALOG ) 0.1 %     Other   Anxiety (Chronic)   Did not continue Celexa , discontinued Had prescribed Cymbalta  30 mg once daily, but did not start it - can be helpful for fatigue/myalgias as well, but she prefers to avoid any medicine for now Has work  related stress Prefers to try relaxation techniques, material provided Continue BH therapy at workplace      B12 deficiency   Had borderline low B12 level, now improved Has chronic fatigue B12 injection today Advised to continue Vitamin B12 500 mcg once daily      Encounter for general adult medical examination with abnormal findings - Primary   Physical exam as documented. Fasting blood tests reviewed today. PAP smear with OB/GYN. Prefers to wait for pneumococcal, Shingrix and Tdap vaccines.      Other Visit Diagnoses       Colon cancer screening       Relevant Orders   Ambulatory referral to Gastroenterology         Meds ordered this encounter  Medications   triamcinolone  cream (KENALOG ) 0.1 %    Sig: Apply 1 Application topically 2 (two) times daily.    Dispense:  30 g    Refill:  0   cyanocobalamin  (VITAMIN B12) injection 1,000 mcg    Follow-up: Return in about 6 months (around 07/06/2024) for HTN.    Suzzane MARLA Blanch, MD

## 2024-01-06 NOTE — Assessment & Plan Note (Signed)
Well controlled with omeprazole Takes probiotic for bloating

## 2024-01-06 NOTE — Assessment & Plan Note (Signed)
 BP Readings from Last 1 Encounters:  01/06/24 132/88   Usually well-controlled with Amlodipine  5 mg QD and HCTZ 12.5 mg QD Counseled for compliance with the medications Advised DASH diet and moderate exercise/walking, at least 150 mins/week

## 2024-01-06 NOTE — Assessment & Plan Note (Addendum)
 Physical exam as documented. Fasting blood tests reviewed today. PAP smear with OB/GYN. Prefers to wait for pneumococcal, Shingrix and Tdap vaccines.

## 2024-01-06 NOTE — Assessment & Plan Note (Addendum)
 Did not continue Celexa , discontinued Had prescribed Cymbalta  30 mg once daily, but did not start it - can be helpful for fatigue/myalgias as well, but she prefers to avoid any medicine for now Has work related stress Prefers to try relaxation techniques, material provided Continue BH therapy at workplace

## 2024-01-06 NOTE — Assessment & Plan Note (Signed)
 Has a history of contact dermatitis Kenalog  cream refilled

## 2024-01-06 NOTE — Assessment & Plan Note (Signed)
 Had borderline low B12 level, now improved Has chronic fatigue B12 injection today Advised to continue Vitamin B12 500 mcg once daily

## 2024-01-06 NOTE — Patient Instructions (Signed)
 Please continue to take medications as prescribed.  Please continue to follow low carb diet and perform moderate exercise/walking at least 150 mins/week.

## 2024-01-07 ENCOUNTER — Ambulatory Visit
Admission: RE | Admit: 2024-01-07 | Discharge: 2024-01-07 | Disposition: A | Discharge status: Home / Self Care | Source: Ambulatory Visit | Attending: Internal Medicine | Admitting: Internal Medicine

## 2024-01-07 DIAGNOSIS — Z1231 Encounter for screening mammogram for malignant neoplasm of breast: Secondary | ICD-10-CM

## 2024-01-08 ENCOUNTER — Other Ambulatory Visit: Payer: Self-pay | Admitting: Internal Medicine

## 2024-01-08 DIAGNOSIS — I10 Essential (primary) hypertension: Secondary | ICD-10-CM

## 2024-01-24 ENCOUNTER — Telehealth: Payer: Self-pay | Admitting: Internal Medicine

## 2024-01-24 NOTE — Telephone Encounter (Signed)
 Form completed, in provider folder for signing

## 2024-01-24 NOTE — Telephone Encounter (Signed)
 FMLA forms Noted Copied Sleeved Original forms placed provider box Copy forms placed front desk folder

## 2024-01-25 NOTE — Telephone Encounter (Signed)
 Forms completed , faxed on 01/25/24, pt informed , forms sent to scan, copy at my desk if needed.

## 2024-02-13 ENCOUNTER — Other Ambulatory Visit: Payer: Self-pay | Admitting: Internal Medicine

## 2024-02-13 DIAGNOSIS — K219 Gastro-esophageal reflux disease without esophagitis: Secondary | ICD-10-CM

## 2024-04-25 ENCOUNTER — Other Ambulatory Visit: Payer: Self-pay | Admitting: Internal Medicine

## 2024-04-25 DIAGNOSIS — I10 Essential (primary) hypertension: Secondary | ICD-10-CM

## 2024-07-06 ENCOUNTER — Ambulatory Visit: Admitting: Internal Medicine
